# Patient Record
Sex: Female | Born: 1955 | Race: White | Hispanic: No | Marital: Married | Smoking: Former smoker
Health system: Southern US, Community
[De-identification: ages and names within clinical notes are randomized; demographics above are authoritative.]

## PROBLEM LIST (undated history)

## (undated) DIAGNOSIS — F329 Major depressive disorder, single episode, unspecified: Secondary | ICD-10-CM

## (undated) DIAGNOSIS — R112 Nausea with vomiting, unspecified: Secondary | ICD-10-CM

## (undated) DIAGNOSIS — H9190 Unspecified hearing loss, unspecified ear: Secondary | ICD-10-CM

## (undated) DIAGNOSIS — F32A Depression, unspecified: Secondary | ICD-10-CM

## (undated) DIAGNOSIS — E7800 Pure hypercholesterolemia, unspecified: Secondary | ICD-10-CM

## (undated) DIAGNOSIS — M1990 Unspecified osteoarthritis, unspecified site: Secondary | ICD-10-CM

## (undated) DIAGNOSIS — E782 Mixed hyperlipidemia: Secondary | ICD-10-CM

## (undated) DIAGNOSIS — R011 Cardiac murmur, unspecified: Secondary | ICD-10-CM

## (undated) DIAGNOSIS — Z9889 Other specified postprocedural states: Secondary | ICD-10-CM

## (undated) DIAGNOSIS — M79673 Pain in unspecified foot: Secondary | ICD-10-CM

## (undated) DIAGNOSIS — R062 Wheezing: Secondary | ICD-10-CM

## (undated) HISTORY — DX: Pure hypercholesterolemia, unspecified: E78.00

## (undated) HISTORY — PX: HALLUX VALGUS CORRECTION: SUR315

## (undated) HISTORY — PX: TYMPANOPLASTY: SHX33

## (undated) HISTORY — PX: OTHER SURGICAL HISTORY: SHX169

---

## 2007-07-18 ENCOUNTER — Ambulatory Visit: Payer: Self-pay | Admitting: Family Medicine

## 2008-09-03 ENCOUNTER — Ambulatory Visit: Payer: Self-pay | Admitting: Family Medicine

## 2010-01-08 ENCOUNTER — Ambulatory Visit: Payer: Self-pay | Admitting: Family Medicine

## 2012-06-23 ENCOUNTER — Ambulatory Visit: Payer: Self-pay | Admitting: Family Medicine

## 2013-03-16 ENCOUNTER — Encounter: Payer: Self-pay | Admitting: Podiatry

## 2013-03-16 ENCOUNTER — Ambulatory Visit (INDEPENDENT_AMBULATORY_CARE_PROVIDER_SITE_OTHER): Payer: BC Managed Care – PPO

## 2013-03-16 ENCOUNTER — Ambulatory Visit (INDEPENDENT_AMBULATORY_CARE_PROVIDER_SITE_OTHER): Payer: BC Managed Care – PPO | Admitting: Podiatry

## 2013-03-16 VITALS — BP 138/78 | HR 90 | Resp 16 | Ht 65.0 in | Wt 150.0 lb

## 2013-03-16 DIAGNOSIS — M779 Enthesopathy, unspecified: Secondary | ICD-10-CM

## 2013-03-16 DIAGNOSIS — M21619 Bunion of unspecified foot: Secondary | ICD-10-CM

## 2013-03-16 DIAGNOSIS — M21611 Bunion of right foot: Secondary | ICD-10-CM

## 2013-03-16 DIAGNOSIS — M21612 Bunion of left foot: Principal | ICD-10-CM

## 2013-03-16 DIAGNOSIS — M2020 Hallux rigidus, unspecified foot: Secondary | ICD-10-CM

## 2013-03-16 NOTE — Progress Notes (Signed)
   Subjective:    Patient ID: Jasmin AmmonsJane Mathews, female    DOB: Jun 08, 1955, 58 y.o.   MRN: [id number]  HPI Comments: Bunion right foot , sometimes painful, started many years ago,just decided it was time to do something about it .     Review of Systems  All other systems reviewed and are negative.       Objective:   Physical Exam        Assessment & Plan:

## 2013-03-16 NOTE — Progress Notes (Signed)
Subjective:     Patient ID: Jasmin AmmonsJane Mathews, female   DOB: [birth year], 58 y.o.   MRN: [id number]  HPI patient presents stating her big toe joint is getting increasingly painful over the last few years. States that she's tried shoe modifications and soaks without relief of symptoms and it hurts when walking or with certain types of shoes  Review of Systems  All other systems reviewed and are negative.       Objective:   Physical Exam  Nursing note and vitals reviewed. Constitutional: She is oriented to person, place, and time.  Cardiovascular: Intact distal pulses.   Musculoskeletal: Normal range of motion.  Neurological: She is oriented to person, place, and time.  Skin: Skin is warm.   neurovascular status intact with normal muscle strength and normal range of motion the subtalar and midtarsal joint. First MPJ joint range of motion right is compromised with approximate 10-15 of dorsiflexion and 10 of plantarflexion with no crepitus with movement but discomfort upon and movement. Discomfort within the first MPJ that is present right foot upon palpation. Fill time is noted to be normal and no equinus condition was noted. I also noted moderate prominence around the first metatarsal head right that becomes tender with pressure     Assessment:     Hallux limitus rigidus condition first MPJ right with structural bunion also noted    Plan:     H&P and x-rays reviewed of feet. Discussed at great length his big toe joint and the treatment options available. Patient has opted for surgical intervention given the length of time she's had and should like to do consent form today I allowed her to read consent form limine line with ample opportunities for question her biplanar Austin bunionectomy or possible joint implantation if the cartilage is severely compromised. I explained that if we do rebuild the joint there is a chance long-term she will require fusion or implant depending on how it responds. She  wants surgery understanding all of this and understand recovery takes  6 months to one year for long-term procedure. Dispense air fracture walker with all instructions on usage and scheduled for outpatient surgery in the next 2-3 weeks encouraged to call with questions

## 2013-03-16 NOTE — Patient Instructions (Signed)

## 2013-03-21 ENCOUNTER — Encounter: Payer: Self-pay | Admitting: Podiatry

## 2013-03-21 NOTE — Progress Notes (Signed)
Pt called and spoke with shelly, cancelled surgery scheduled on 3.3.15 with dr Charlsie Merlesregal due to having arthritis issues. Did not want to r/s surgery at this time.

## 2013-04-10 ENCOUNTER — Encounter: Payer: BC Managed Care – PPO | Admitting: Podiatry

## 2014-04-09 ENCOUNTER — Ambulatory Visit: Payer: Self-pay | Admitting: Family Medicine

## 2014-11-14 ENCOUNTER — Encounter: Payer: Self-pay | Admitting: *Deleted

## 2014-11-15 ENCOUNTER — Ambulatory Visit
Admission: RE | Admit: 2014-11-15 | Discharge: 2014-11-15 | Disposition: A | Discharge status: Home / Self Care | Payer: Commercial Managed Care - HMO | Source: Ambulatory Visit | Attending: Gastroenterology | Admitting: Gastroenterology

## 2014-11-15 ENCOUNTER — Ambulatory Visit: Payer: Commercial Managed Care - HMO | Admitting: Certified Registered"

## 2014-11-15 ENCOUNTER — Encounter: Payer: Self-pay | Admitting: *Deleted

## 2014-11-15 ENCOUNTER — Encounter
Admission: RE | Disposition: A | Discharge status: Home / Self Care | Payer: Self-pay | Source: Ambulatory Visit | Attending: Gastroenterology

## 2014-11-15 DIAGNOSIS — Z1211 Encounter for screening for malignant neoplasm of colon: Secondary | ICD-10-CM | POA: Insufficient documentation

## 2014-11-15 DIAGNOSIS — Z79899 Other long term (current) drug therapy: Secondary | ICD-10-CM | POA: Insufficient documentation

## 2014-11-15 DIAGNOSIS — F329 Major depressive disorder, single episode, unspecified: Secondary | ICD-10-CM | POA: Diagnosis not present

## 2014-11-15 DIAGNOSIS — E7800 Pure hypercholesterolemia, unspecified: Secondary | ICD-10-CM | POA: Insufficient documentation

## 2014-11-15 DIAGNOSIS — K5730 Diverticulosis of large intestine without perforation or abscess without bleeding: Secondary | ICD-10-CM | POA: Insufficient documentation

## 2014-11-15 DIAGNOSIS — E781 Pure hyperglyceridemia: Secondary | ICD-10-CM | POA: Diagnosis not present

## 2014-11-15 HISTORY — DX: Mixed hyperlipidemia: E78.2

## 2014-11-15 HISTORY — DX: Depression, unspecified: F32.A

## 2014-11-15 HISTORY — DX: Major depressive disorder, single episode, unspecified: F32.9

## 2014-11-15 HISTORY — DX: Pain in unspecified foot: M79.673

## 2014-11-15 HISTORY — DX: Other specified postprocedural states: Z98.890

## 2014-11-15 HISTORY — DX: Nausea with vomiting, unspecified: R11.2

## 2014-11-15 HISTORY — PX: COLONOSCOPY WITH PROPOFOL: SHX5780

## 2014-11-15 SURGERY — COLONOSCOPY WITH PROPOFOL
Anesthesia: General

## 2014-11-15 MED ORDER — MIDAZOLAM HCL 5 MG/5ML IJ SOLN
INTRAMUSCULAR | Status: DC | PRN
Start: 2014-11-15 — End: 2014-11-15
  Administered 2014-11-15 (×2): 1 mg via INTRAVENOUS

## 2014-11-15 MED ORDER — SODIUM CHLORIDE 0.9 % IV SOLN
INTRAVENOUS | Status: DC
  Administered 2014-11-15: 1000 mL via INTRAVENOUS

## 2014-11-15 MED ORDER — SODIUM CHLORIDE 0.9 % IV SOLN: INTRAVENOUS | Status: DC

## 2014-11-15 MED ORDER — LIDOCAINE HCL (CARDIAC) 20 MG/ML IV SOLN
INTRAVENOUS | Status: DC | PRN
  Administered 2014-11-15: 50 mg via INTRAVENOUS

## 2014-11-15 MED ORDER — PROPOFOL 10 MG/ML IV BOLUS
INTRAVENOUS | Status: DC | PRN
  Administered 2014-11-15: 70 mg via INTRAVENOUS

## 2014-11-15 MED ORDER — PROPOFOL 500 MG/50ML IV EMUL
INTRAVENOUS | Status: DC | PRN
  Administered 2014-11-15: 120 ug/kg/min via INTRAVENOUS

## 2014-11-15 NOTE — Op Note (Signed)
[facility] Gastroenterology Patient Name: Jasmin AmmonsJane Hollon Procedure Date: 11/15/2014 2:20 PM MRN: [id number] Account #: [id number] Date of Birth: [birth year] Admit Type: Outpatient Age: [id number] [facility] END[facility] ROOM 3 Gender: Female Note Status: Finalized Procedure:         Colonoscopy Indications:       Screening for colorectal malignant neoplasm, This is the                     patient's first colonoscopy Providers:         Christena DeemMartin U. Roseland Braun, MD Referring MD:      Leim FabryBarbara Aldridge, MD (Referring MD) Medicines:         Monitored Anesthesia Care Complications:     No immediate complications. Procedure:         Pre-Anesthesia Assessment:                    - ASA Grade Assessment: II - A patient with mild systemic                     disease.                    After obtaining informed consent, the colonoscope was                     passed under direct vision. Throughout the procedure, the                     patient's blood pressure, pulse, and oxygen saturations                     were monitored continuously. The Colonoscope was                     introduced through the anus and advanced to the the cecum,                     identified by appendiceal orifice and ileocecal valve. The                     colonoscopy was performed without difficulty. The patient                     tolerated the procedure well. The quality of the bowel                     preparation was good. Findings:      Multiple small to medium diverticula were found in the sigmoid colon and       in the descending colon.      The exam was otherwise without abnormality.      The digital rectal exam was normal.      Non-bleeding internal hemorrhoids were found during anoscopy. The       hemorrhoids were small. Impression:        - Diverticulosis in the sigmoid colon and in the                     descending colon.                    - The examination was otherwise normal.                    - No  specimens collected. Recommendation:    -  Repeat colonoscopy in 10 years for screening purposes. Procedure Code(s): --- Professional ---                    [phone], Colonoscopy, flexible; diagnostic, including                     collection of specimen(s) by brushing or washing, when                     performed (separate procedure) Diagnosis Code(s): --- Professional ---                    V76.51, Special screening for malignant neoplasms of colon                    562.10, Diverticulosis of colon (without mention of                     hemorrhage) CPT copyright 2014 American Medical Association. All rights reserved. The codes documented in this report are preliminary and upon coder review may  be revised to meet current compliance requirements. Christena Deem, MD 11/15/2014 2:47:03 PM This report has been signed electronically. Number of Addenda: 0 Note Initiated On: 11/15/2014 2:20 PM Scope Withdrawal Time: 0 hours 9 minutes 38 seconds  Total Procedure Duration: 0 hours 18 minutes 17 seconds       [facility]

## 2014-11-15 NOTE — Anesthesia Postprocedure Evaluation (Signed)
  Anesthesia Post-op Note  Patient: Jasmin Mathews  Procedure(s) Performed: Procedure(s): COLONOSCOPY WITH PROPOFOL (N/A)  Anesthesia type:General  Patient location: PACU  Post pain: Pain level controlled  Post assessment: Post-op Vital signs reviewed, Patient's Cardiovascular Status Stable, Respiratory Function Stable, Patent Airway and No signs of Nausea or vomiting  Post vital signs: Reviewed and stable  Last Vitals:  Filed Vitals:   11/15/14 1446  BP: 112/63  Pulse: 82  Temp: 36.2 C  Resp: 18    Level of consciousness: awake, alert  and patient cooperative  Complications: No apparent anesthesia complications

## 2014-11-15 NOTE — Transfer of Care (Signed)
Immediate Anesthesia Transfer of Care Note  Patient: Ardelle BallsJane Wrona Fuerstenberg  Procedure(s) Performed: Procedure(s): COLONOSCOPY WITH PROPOFOL (N/A)  Patient Location: Endoscopy Unit  Anesthesia Type:General  Level of Consciousness: awake  Airway & Oxygen Therapy: Patient Spontanous Breathing and Patient connected to nasal cannula oxygen  Post-op Assessment: Report given to RN  Post vital signs: Reviewed  Last Vitals:  Filed Vitals:   11/15/14 1446  BP: 112/63  Pulse: 82  Temp: 36.2 C  Resp: 18    Complications: No apparent anesthesia complications

## 2014-11-15 NOTE — Anesthesia Preprocedure Evaluation (Signed)
Anesthesia Evaluation  Patient identified by MRN, date of birth, ID band Patient awake    Reviewed: Allergy & Precautions, NPO status , Patient's Chart, lab work & pertinent test results  History of Anesthesia Complications (+) PONV  Airway Mallampati: III  TM Distance: >3 FB Neck ROM: Limited    Dental  (+) Teeth Intact   Pulmonary former smoker,    Pulmonary exam normal        Cardiovascular negative cardio ROS Normal cardiovascular exam     Neuro/Psych Depression    GI/Hepatic   Endo/Other    Renal/GU      Musculoskeletal   Abdominal (+)  Abdomen: soft.    Peds  Hematology   Anesthesia Other Findings   Reproductive/Obstetrics                             Anesthesia Physical Anesthesia Plan  ASA: II  Anesthesia Plan: General   Post-op Pain Management:    Induction: Intravenous  Airway Management Planned: Nasal Cannula  Additional Equipment:   Intra-op Plan:   Post-operative Plan:   Informed Consent: I have reviewed the patients History and Physical, chart, labs and discussed the procedure including the risks, benefits and alternatives for the proposed anesthesia with the patient or authorized representative who has indicated his/her understanding and acceptance.     Plan Discussed with: CRNA  Anesthesia Plan Comments: (PONV is from the chart--she herself denies problems with anesthesia.)        Anesthesia Quick Evaluation

## 2014-11-15 NOTE — H&P (Signed)
Outpatient short stay form Pre-procedure 11/15/2014 2:07 PM Jasmin Mathews Jasmin Minassian MD  Primary Physician: Dr. Leim FabryBarbara Mathews  Reason for visit:  Screening colonoscopy  History of present illness:  Patient is a 59 year old female presenting today for colonoscopy. She has not had a colonoscopy in the past. Tolerated her prep well. He takes no aspirin products or blood thinners.    Current facility-administered medications:  .  0.9 %  sodium chloride infusion, , Intravenous, Continuous, Jasmin Mathews Jasmin Upshur, MD, Last Rate: 20 mL/hr at 11/15/14 1353, 1,000 mL at 11/15/14 1353 .  0.9 %  sodium chloride infusion, , Intravenous, Continuous, Jasmin Mathews Jasmin Klang, MD  Prescriptions prior to admission  Medication Sig Dispense Refill Last Dose  . PARoxetine (PAXIL) 30 MG tablet      . simvastatin (ZOCOR) 20 MG tablet      . venlafaxine (EFFEXOR) 75 MG tablet         No Known Allergies   Past Medical History  Diagnosis Date  . High cholesterol   . Depression   . Foot arch pain   . PONV (postoperative nausea and vomiting)   . Elevated cholesterol with high triglycerides     Review of systems:      Physical Exam    Heart and lungs: Regular rate and rhythm without rub or gallop, lungs are bilaterally clear.    HEENT: Normocephalic atraumatic eyes are anicteric    Other:     Pertinant exam for procedure: Soft nontender nondistended bowel sounds are positive normoactive    Planned proceedures: Colonoscopy and indicated procedures I have discussed the risks benefits and complications of procedures to include not limited to bleeding, infection, perforation and the risk of sedation and the patient wishes to proceed.    Jasmin Mathews Jasmin Moorehouse, MD Gastroenterology 11/15/2014  2:07 PM

## 2014-11-20 ENCOUNTER — Encounter: Payer: Self-pay | Admitting: Gastroenterology

## 2016-06-07 ENCOUNTER — Other Ambulatory Visit: Payer: Self-pay | Admitting: Family Medicine

## 2016-06-07 DIAGNOSIS — Z1231 Encounter for screening mammogram for malignant neoplasm of breast: Secondary | ICD-10-CM

## 2016-06-24 ENCOUNTER — Ambulatory Visit
Admission: RE | Admit: 2016-06-24 | Discharge: 2016-06-24 | Disposition: A | Discharge status: Home / Self Care | Payer: BLUE CROSS/BLUE SHIELD | Source: Ambulatory Visit | Attending: Family Medicine | Admitting: Family Medicine

## 2016-06-24 DIAGNOSIS — Z1231 Encounter for screening mammogram for malignant neoplasm of breast: Secondary | ICD-10-CM | POA: Insufficient documentation

## 2016-12-05 ENCOUNTER — Other Ambulatory Visit: Payer: Self-pay

## 2016-12-05 ENCOUNTER — Emergency Department
Admission: EM | Admit: 2016-12-05 | Discharge: 2016-12-05 | Disposition: A | Discharge status: Home / Self Care | Payer: Managed Care, Other (non HMO) | Attending: Emergency Medicine | Admitting: Emergency Medicine

## 2016-12-05 ENCOUNTER — Encounter: Payer: Self-pay | Admitting: Emergency Medicine

## 2016-12-05 ENCOUNTER — Emergency Department: Payer: Managed Care, Other (non HMO)

## 2016-12-05 DIAGNOSIS — Y9389 Activity, other specified: Secondary | ICD-10-CM | POA: Diagnosis not present

## 2016-12-05 DIAGNOSIS — Y9289 Other specified places as the place of occurrence of the external cause: Secondary | ICD-10-CM | POA: Diagnosis not present

## 2016-12-05 DIAGNOSIS — W19XXXA Unspecified fall, initial encounter: Secondary | ICD-10-CM

## 2016-12-05 DIAGNOSIS — W010XXA Fall on same level from slipping, tripping and stumbling without subsequent striking against object, initial encounter: Secondary | ICD-10-CM | POA: Insufficient documentation

## 2016-12-05 DIAGNOSIS — S299XXA Unspecified injury of thorax, initial encounter: Secondary | ICD-10-CM | POA: Diagnosis present

## 2016-12-05 DIAGNOSIS — S20212A Contusion of left front wall of thorax, initial encounter: Secondary | ICD-10-CM | POA: Insufficient documentation

## 2016-12-05 DIAGNOSIS — Z87891 Personal history of nicotine dependence: Secondary | ICD-10-CM | POA: Diagnosis not present

## 2016-12-05 DIAGNOSIS — Y92009 Unspecified place in unspecified non-institutional (private) residence as the place of occurrence of the external cause: Secondary | ICD-10-CM

## 2016-12-05 DIAGNOSIS — Y999 Unspecified external cause status: Secondary | ICD-10-CM | POA: Insufficient documentation

## 2016-12-05 MED ORDER — HYDROCODONE-ACETAMINOPHEN 5-325 MG PO TABS
1.0000 | ORAL_TABLET | Freq: Once | ORAL | Status: AC
  Administered 2016-12-05: 1 via ORAL
  Filled 2016-12-05: qty 1

## 2016-12-05 MED ORDER — HYDROCODONE-ACETAMINOPHEN 5-325 MG PO TABS: 2.0000 | ORAL_TABLET | Freq: Four times a day (QID) | ORAL | 0 refills | Status: DC | PRN

## 2016-12-05 NOTE — ED Provider Notes (Signed)
[facility] Emergency Department Provider Note  ____________________________________________   First MD Initiated Contact with Patient 12/05/16 1216     (approximate)  I have reviewed the triage vital signs and the nursing notes.   HISTORY  Chief Complaint Fall   HPI Jasmin Mathews is a 61 y.o. female she is complaining of left rib pain.patient states that she was outside last evening with her dog when she slipped in some mud causing her to fall on her left side. Since that time it has been very painful for her to take deep inspirations. Today it is been worse. She has takenTylenol approximately 4 hours ago with minimal relief of her pain. She denies any head injury or loss of consciousness.she rates her pain as 4 out of 10.     Past Medical History:  Diagnosis Date  . Depression   . Elevated cholesterol with high triglycerides   . Foot arch pain   . High cholesterol   . PONV (postoperative nausea and vomiting)     There are no active problems to display for this patient.   Past Surgical History:  Procedure Laterality Date  . Fusion rt great toe Right   . HALLUX VALGUS CORRECTION Right     Prior to Admission medications   Medication Sig Start Date End Date Taking? Authorizing Provider  HYDROcodone-acetaminophen (NORCO/VICODIN) 5-325 MG tablet Take 2 tablets every 6 (six) hours as needed by mouth for moderate pain. 12/05/16 12/05/17  Tommi Rumps, PA-C  PARoxetine (PAXIL) 30 MG tablet  03/06/13   [provider]  simvastatin (ZOCOR) 20 MG tablet  01/24/13   [provider]  venlafaxine [facility]) 75 MG tablet  03/12/13   [provider]    Allergies Patient has no known allergies.  Family History  Problem Relation Age of Onset  . Breast cancer Neg Hx     Social History Social History   Tobacco Use  . Smoking status: Former Games developer  . Smokeless tobacco: Never Used  Substance Use Topics  . Alcohol use: No    . Drug use: No    Review of Systems Constitutional: No fever/chills Eyes: No visual changes. ENT: no trauma Cardiovascular: Denies chest pain. Respiratory: Denies shortness of breath. Gastrointestinal: No abdominal pain.  No nausea, no vomiting.  Musculoskeletal: positive for left rib pain. Skin: No injury. Neurological: Negative for headaches, focal weakness or numbness. ____________________________________________   PHYSICAL EXAM:  VITAL SIGNS: ED Triage Vitals [12/05/16 1158]  Enc Vitals Group     BP 139/73     Pulse Rate 80     Resp 16     Temp 97.6 F (36.4 C)     Temp Source Oral     SpO2 98 %     Weight      Height      Head Circumference      Peak Flow      Pain Score 4     Pain Loc      Pain Edu?      Excl. in GC?    Constitutional: Alert and oriented. Well appearing and in no acute distress. Eyes: Conjunctivae are normal.  Head: Atraumatic. Nose: no trauma Neck: No stridor.  No cervical tenderness on palpation posteriorly. Range of motion was without restriction. Cardiovascular: Normal rate, regular rhythm. Grossly normal heart sounds.  Good peripheral circulation. Respiratory: Normal respiratory effort.  No retractions. Lungs CTAB. There is moderate tenderness on palpation of the left lower ribs  however there is no soft tissue swelling or ecchymosis noted. Deep inspirations or guarded secondary to patient's discomfort. Gastrointestinal: Soft and nontender. No distention.  Musculoskeletal: to his upper and lower extremities without difficulty. Normal gait was noted. Neurologic:  Normal speech and language. No gross focal neurologic deficits are appreciated.  Skin:  Skin is warm, dry and intact. Psychiatric: Mood and affect are normal. Speech and behavior are normal.  ____________________________________________   LABS (all labs ordered are listed, but only abnormal results are displayed)  Labs Reviewed - No data to display   RADIOLOGY  Dg Ribs  Unilateral W/chest Left  Result Date: 12/05/2016 CLINICAL DATA:  61 year old female status post fall last night outside on to brick patio. Pain under the left breast. EXAM: LEFT RIBS AND CHEST - 3+ VIEW COMPARISON:  None. FINDINGS: Normal cardiac size and mediastinal contours. Visualized tracheal air column is within normal limits. Lung volumes are within normal limits. No pneumothorax, pulmonary edema, pleural effusion or confluent opacity. Negative visible bowel gas pattern. Bone mineralization is within normal limits for age. Rib marker placed at the left anterior seventh/eighth rib level. No displaced rib fracture identified. Normal thoracic segmentation. Other visible osseous structures appear intact. IMPRESSION: 1.  No displaced left rib fracture identified. 2.  No acute cardiopulmonary abnormality. Electronically Signed   By: Odessa FlemingH  Hall M.D.   On: 12/05/2016 14:01    ____________________________________________   PROCEDURES  Procedure(s) performed: None  Procedures  Critical Care performed: No  ____________________________________________   INITIAL IMPRESSION / ASSESSMENT AND PLAN / ED COURSE  Patient was given Norco while in the emergency department. X-rays were discussed with both she and her husband. Patient was discharged with a prescription for Norco one every 6 hours as needed for pain.  Patient is follow-up with her PCP if any continued problems. ____________________________________________   FINAL CLINICAL IMPRESSION(S) / ED DIAGNOSES  Final diagnoses:  Rib contusion, left, initial encounter  Fall in home, initial encounter      NEW MEDICATIONS STARTED DURING THIS VISIT:  This SmartLink is deprecated. Use AVSMEDLIST instead to display the medication list for a patient.   Note:  This document was prepared using Dragon voice recognition software and may include unintentional dictation errors.    Tommi RumpsSummers, Tykel Badie L, PA-C 12/05/16 1526    Governor RooksLord, Rebecca,  MD 12/08/16 1120

## 2016-12-05 NOTE — ED Triage Notes (Signed)
Pt states that she was outside last night walking her dogs and slipped in the mud landing on her left side. Pt is c/o pain in left ribs that is worse with movement. Pt in NAD at this time.

## 2016-12-05 NOTE — Discharge Instructions (Signed)
Follow up with primary care doctor if any continued problems. Begin taking ibuprofen 2 tablets 3 times a day with food. Take Norco as needed for moderate to severe pain. This medication could cause drowsiness increase her risk for falling. Do not drive or operate machinery while taking this medication. You may use ice to decrease swelling or pain.

## 2017-05-12 ENCOUNTER — Encounter: Payer: Self-pay | Admitting: *Deleted

## 2017-05-24 ENCOUNTER — Ambulatory Visit: Payer: Managed Care, Other (non HMO) | Admitting: Anesthesiology

## 2017-05-24 ENCOUNTER — Ambulatory Visit
Admission: RE | Admit: 2017-05-24 | Discharge: 2017-05-24 | Disposition: A | Discharge status: Home / Self Care | Payer: Managed Care, Other (non HMO) | Source: Ambulatory Visit | Attending: Ophthalmology | Admitting: Ophthalmology

## 2017-05-24 ENCOUNTER — Encounter
Admission: RE | Disposition: A | Discharge status: Home / Self Care | Payer: Self-pay | Source: Ambulatory Visit | Attending: Ophthalmology

## 2017-05-24 DIAGNOSIS — H2512 Age-related nuclear cataract, left eye: Secondary | ICD-10-CM | POA: Insufficient documentation

## 2017-05-24 DIAGNOSIS — Z87891 Personal history of nicotine dependence: Secondary | ICD-10-CM | POA: Diagnosis not present

## 2017-05-24 HISTORY — DX: Unspecified osteoarthritis, unspecified site: M19.90

## 2017-05-24 HISTORY — PX: CATARACT EXTRACTION W/PHACO: SHX586

## 2017-05-24 HISTORY — DX: Unspecified hearing loss, unspecified ear: H91.90

## 2017-05-24 HISTORY — DX: Cardiac murmur, unspecified: R01.1

## 2017-05-24 HISTORY — DX: Wheezing: R06.2

## 2017-05-24 SURGERY — PHACOEMULSIFICATION, CATARACT, WITH IOL INSERTION
Anesthesia: Monitor Anesthesia Care | Site: Eye | Laterality: Left | Wound class: "Clean "

## 2017-05-24 MED ORDER — EPINEPHRINE PF 1 MG/ML IJ SOLN
INTRAOCULAR | Status: DC | PRN
  Administered 2017-05-24: 1 mL via OPHTHALMIC

## 2017-05-24 MED ORDER — FENTANYL CITRATE (PF) 100 MCG/2ML IJ SOLN
INTRAMUSCULAR | Status: AC
  Filled 2017-05-24: qty 2

## 2017-05-24 MED ORDER — CARBACHOL 0.01 % IO SOLN
INTRAOCULAR | Status: DC | PRN
  Administered 2017-05-24: .5 mL via INTRAOCULAR

## 2017-05-24 MED ORDER — MOXIFLOXACIN HCL 0.5 % OP SOLN
OPHTHALMIC | Status: AC
  Filled 2017-05-24: qty 3

## 2017-05-24 MED ORDER — MIDAZOLAM HCL 2 MG/2ML IJ SOLN
INTRAMUSCULAR | Status: DC | PRN
  Administered 2017-05-24: 2 mg via INTRAVENOUS

## 2017-05-24 MED ORDER — ARMC OPHTHALMIC DILATING DROPS
OPHTHALMIC | Status: AC
  Administered 2017-05-24: 1 via OPHTHALMIC
  Filled 2017-05-24: qty 0.4

## 2017-05-24 MED ORDER — FENTANYL CITRATE (PF) 100 MCG/2ML IJ SOLN
INTRAMUSCULAR | Status: DC | PRN
  Administered 2017-05-24: 50 ug via INTRAVENOUS
  Administered 2017-05-24 (×2): 25 ug via INTRAVENOUS

## 2017-05-24 MED ORDER — LIDOCAINE HCL (PF) 4 % IJ SOLN
INTRAOCULAR | Status: DC | PRN
  Administered 2017-05-24: 2 mL via OPHTHALMIC

## 2017-05-24 MED ORDER — POVIDONE-IODINE 5 % OP SOLN
OPHTHALMIC | Status: DC | PRN
  Administered 2017-05-24: 1 via OPHTHALMIC

## 2017-05-24 MED ORDER — MOXIFLOXACIN HCL 0.5 % OP SOLN: 1.0000 [drp] | OPHTHALMIC | Status: DC | PRN

## 2017-05-24 MED ORDER — ARMC OPHTHALMIC DILATING DROPS
1.0000 "application " | OPHTHALMIC | Status: AC
  Administered 2017-05-24 (×3): 1 via OPHTHALMIC

## 2017-05-24 MED ORDER — MOXIFLOXACIN HCL 0.5 % OP SOLN
OPHTHALMIC | Status: DC | PRN
  Administered 2017-05-24: .2 mL via OPHTHALMIC

## 2017-05-24 MED ORDER — MIDAZOLAM HCL 2 MG/2ML IJ SOLN
INTRAMUSCULAR | Status: AC
  Filled 2017-05-24: qty 2

## 2017-05-24 MED ORDER — SODIUM CHLORIDE 0.9 % IV SOLN
INTRAVENOUS | Status: DC
  Administered 2017-05-24: 10:00:00 via INTRAVENOUS

## 2017-05-24 MED ORDER — NA CHONDROIT SULF-NA HYALURON 40-17 MG/ML IO SOLN
INTRAOCULAR | Status: DC | PRN
  Administered 2017-05-24: 1 mL via INTRAOCULAR

## 2017-05-24 SURGICAL SUPPLY — 16 items
GLOVE BIO SURGEON STRL SZ8 (GLOVE) ×3 IMPLANT
GLOVE BIOGEL M 6.5 STRL (GLOVE) ×3 IMPLANT
GLOVE SURG LX 8.0 MICRO (GLOVE) ×2
GLOVE SURG LX STRL 8.0 MICRO (GLOVE) ×1 IMPLANT
GOWN STRL REUS W/ TWL LRG LVL3 (GOWN DISPOSABLE) ×2 IMPLANT
GOWN STRL REUS W/TWL LRG LVL3 (GOWN DISPOSABLE) ×4
LABEL CATARACT MEDS ST (LABEL) ×3 IMPLANT
LENS IOL TECNIS ITEC 19.0 (Intraocular Lens) ×2 IMPLANT
PACK CATARACT (MISCELLANEOUS) ×3 IMPLANT
PACK CATARACT BRASINGTON LX (MISCELLANEOUS) ×3 IMPLANT
PACK EYE AFTER SURG (MISCELLANEOUS) ×3 IMPLANT
SOL BSS BAG (MISCELLANEOUS) ×3
SOLUTION BSS BAG (MISCELLANEOUS) ×1 IMPLANT
SYR 5ML LL (SYRINGE) ×3 IMPLANT
WATER STERILE IRR 250ML POUR (IV SOLUTION) ×3 IMPLANT
WIPE NON LINTING 3.25X3.25 (MISCELLANEOUS) ×3 IMPLANT

## 2017-05-24 NOTE — Discharge Instructions (Signed)
FOLLOW DR. PORFILIO'S POSTOP DISCHARGE INSTRUCTION SHEET AS REVIEWED.  Eye Surgery Discharge Instructions  Expect mild scratchy sensation or mild soreness. DO NOT RUB YOUR EYE!  The day of surgery:  Minimal physical activity, but bed rest is not required  No reading, computer work, or close hand work  No bending, lifting, or straining.  May watch TV  For 24 hours:  No driving, legal decisions, or alcoholic beverages  Safety precautions  Eat anything you prefer: It is better to start with liquids, then soup then solid foods.  _____ Eye patch should be worn until postoperative exam tomorrow.  ____ Solar shield eyeglasses should be worn for comfort in the sunlight/patch while sleeping  Resume all regular medications including aspirin or Coumadin if these were discontinued prior to surgery. You may shower, bathe, shave, or wash your hair. Tylenol may be taken for mild discomfort.  Call your doctor if you experience significant pain, nausea, or vomiting, fever > 101 or other signs of infection. [phone] or [phone] Specific instructions:  Follow-up Information    Galen ManilaPorfilio, William, MD Follow up.   Specialty:  Ophthalmology Why:  Wednesday 05/25/17 @ 9:00 am Contact information: [address] [place] [id number] [phone]

## 2017-05-24 NOTE — Op Note (Signed)
PREOPERATIVE DIAGNOSIS:  Nuclear sclerotic cataract of the left eye.   POSTOPERATIVE DIAGNOSIS:  Nuclear sclerotic cataract of the left eye.   OPERATIVE PROCEDURE: Procedure(s): CATARACT EXTRACTION PHACO AND INTRAOCULAR LENS PLACEMENT (IOC)   SURGEON:  Galen ManilaWilliam Teoman Giraud, MD.   ANESTHESIA:  Anesthesiologist: Alver FisherPenwarden, Amy, MD CRNA: Karoline CaldwellStarr, Deana, CRNA Student Nurse Anesthetist: Marylen PontoVanderford, Kate W, RN  1.      Managed anesthesia care. 2.     0.751ml of Shugarcaine was instilled following the paracentesis   COMPLICATIONS:  None.   TECHNIQUE:   Stop and chop   DESCRIPTION OF PROCEDURE:  The patient was examined and consented in the preoperative holding area where the aforementioned topical anesthesia was applied to the left eye and then brought back to the Operating Room where the left eye was prepped and draped in the usual sterile ophthalmic fashion and a lid speculum was placed. A paracentesis was created with the side port blade and the anterior chamber was filled with viscoelastic. A near clear corneal incision was performed with the steel keratome. A continuous curvilinear capsulorrhexis was performed with a cystotome followed by the capsulorrhexis forceps. Hydrodissection and hydrodelineation were carried out with BSS on a blunt cannula. The lens was removed in a stop and chop  technique and the remaining cortical material was removed with the irrigation-aspiration handpiece. The capsular bag was inflated with viscoelastic and the Technis [id number] lens was placed in the capsular bag without complication. The remaining viscoelastic was removed from the eye with the irrigation-aspiration handpiece. The wounds were hydrated. The anterior chamber was flushed with Miostat and the eye was inflated to physiologic pressure. 0.571ml Vigamox was placed in the anterior chamber. The wounds were found to be water tight. The eye was dressed with Vigamox. The patient was given protective glasses to wear throughout  the day and a shield with which to sleep tonight. The patient was also given drops with which to begin a drop regimen today and will follow-up with me in one day. Implant Name Type Inv. Item Serial No. Manufacturer Lot No. LRB No. Used  LENS IOL DIOP 19.0 - [id number] Intraocular Lens LENS IOL DIOP 19.0 [id number] AMO  Left 1    Procedure(s) with comments: CATARACT EXTRACTION PHACO AND INTRAOCULAR LENS PLACEMENT (IOC) (Left) - US 00:21 AP% 11.0 CDE 2.32 Fluid pack lot # [id number]  Electronically signed: Galen ManilaWilliam Shanikia [employer] 05/24/2017 11:25 AM

## 2017-05-24 NOTE — Anesthesia Preprocedure Evaluation (Signed)
Anesthesia Evaluation  Patient identified by MRN, date of birth, ID band Patient awake    Reviewed: Allergy & Precautions, NPO status , Patient's Chart, lab work & pertinent test results  History of Anesthesia Complications (+) PONV and history of anesthetic complications  Airway Mallampati: II       Dental   Pulmonary neg sleep apnea, neg COPD, former smoker,           Cardiovascular (-) hypertension(-) Past MI and (-) CHF (-) pacemaker+ Valvular Problems/Murmurs (murmur, as a child)      Neuro/Psych neg Seizures Depression    GI/Hepatic Neg liver ROS, neg GERD  ,  Endo/Other  neg diabetes  Renal/GU negative Renal ROS     Musculoskeletal   Abdominal   Peds  Hematology   Anesthesia Other Findings   Reproductive/Obstetrics                             Anesthesia Physical Anesthesia Plan  ASA: II  Anesthesia Plan: MAC   Post-op Pain Management:    Induction:   PONV Risk Score and Plan:   Airway Management Planned: Nasal Cannula  Additional Equipment:   Intra-op Plan:   Post-operative Plan:   Informed Consent: I have reviewed the patients History and Physical, chart, labs and discussed the procedure including the risks, benefits and alternatives for the proposed anesthesia with the patient or authorized representative who has indicated his/her understanding and acceptance.     Plan Discussed with:   Anesthesia Plan Comments:         Anesthesia Quick Evaluation

## 2017-05-24 NOTE — Transfer of Care (Signed)
Immediate Anesthesia Transfer of Care Note  Patient: Jasmin Mathews  Procedure(s) Performed: CATARACT EXTRACTION PHACO AND INTRAOCULAR LENS PLACEMENT (IOC) (Left Eye)  Patient Location: PACU  Anesthesia Type:MAC  Level of Consciousness: awake, alert  and oriented  Airway & Oxygen Therapy: Patient Spontanous Breathing  Post-op Assessment: Report given to RN  Post vital signs: Reviewed and stable  Last Vitals:  Vitals Value Taken Time  BP    Temp    Pulse    Resp    SpO2      Last Pain:  Vitals:   05/24/17 1009  TempSrc: Oral  PainSc: 0-No pain         Complications: No apparent anesthesia complications

## 2017-05-24 NOTE — H&P (Signed)
All labs reviewed. Abnormal studies sent to patients PCP when indicated.  Previous H&P reviewed, patient examined, there are NO CHANGES.  Jasmin Tortorelli Porfilio4/23/201911:02 AM

## 2017-05-24 NOTE — Anesthesia Postprocedure Evaluation (Signed)
Anesthesia Post Note  Patient: Jasmin Mathews  Procedure(s) Performed: CATARACT EXTRACTION PHACO AND INTRAOCULAR LENS PLACEMENT (IOC) (Left Eye)  Patient location during evaluation: PACU Anesthesia Type: MAC Level of consciousness: awake and awake and alert Pain management: pain level controlled Vital Signs Assessment: post-procedure vital signs reviewed and stable Respiratory status: spontaneous breathing Cardiovascular status: blood pressure returned to baseline Postop Assessment: no apparent nausea or vomiting Anesthetic complications: no     Last Vitals:  Vitals:   05/24/17 1009  BP: (!) 124/91  Pulse: 79  Resp: 15  Temp: 36.4 C  SpO2: 98%    Last Pain:  Vitals:   05/24/17 1009  TempSrc: Oral  PainSc: 0-No pain                 Eloise Harman Jakyla Reza

## 2017-05-24 NOTE — Anesthesia Post-op Follow-up Note (Signed)
Anesthesia QCDR form completed.        

## 2017-05-25 ENCOUNTER — Encounter: Payer: Self-pay | Admitting: Ophthalmology

## 2017-06-09 ENCOUNTER — Encounter: Payer: Self-pay | Admitting: *Deleted

## 2017-06-14 ENCOUNTER — Ambulatory Visit: Payer: Managed Care, Other (non HMO) | Admitting: Anesthesiology

## 2017-06-14 ENCOUNTER — Encounter
Admission: RE | Disposition: A | Discharge status: Home / Self Care | Payer: Self-pay | Source: Ambulatory Visit | Attending: Ophthalmology

## 2017-06-14 ENCOUNTER — Ambulatory Visit
Admission: RE | Admit: 2017-06-14 | Discharge: 2017-06-14 | Disposition: A | Discharge status: Home / Self Care | Payer: Managed Care, Other (non HMO) | Source: Ambulatory Visit | Attending: Ophthalmology | Admitting: Ophthalmology

## 2017-06-14 DIAGNOSIS — Z87891 Personal history of nicotine dependence: Secondary | ICD-10-CM | POA: Insufficient documentation

## 2017-06-14 DIAGNOSIS — F329 Major depressive disorder, single episode, unspecified: Secondary | ICD-10-CM | POA: Diagnosis not present

## 2017-06-14 DIAGNOSIS — E7800 Pure hypercholesterolemia, unspecified: Secondary | ICD-10-CM | POA: Diagnosis not present

## 2017-06-14 DIAGNOSIS — H2511 Age-related nuclear cataract, right eye: Secondary | ICD-10-CM | POA: Insufficient documentation

## 2017-06-14 DIAGNOSIS — Z79899 Other long term (current) drug therapy: Secondary | ICD-10-CM | POA: Insufficient documentation

## 2017-06-14 HISTORY — PX: CATARACT EXTRACTION W/PHACO: SHX586

## 2017-06-14 SURGERY — PHACOEMULSIFICATION, CATARACT, WITH IOL INSERTION
Anesthesia: Monitor Anesthesia Care | Site: Eye | Laterality: Right | Wound class: "Clean "

## 2017-06-14 MED ORDER — MOXIFLOXACIN HCL 0.5 % OP SOLN: 1.0000 [drp] | OPHTHALMIC | Status: DC | PRN

## 2017-06-14 MED ORDER — MIDAZOLAM HCL 2 MG/2ML IJ SOLN
INTRAMUSCULAR | Status: AC
  Filled 2017-06-14: qty 2

## 2017-06-14 MED ORDER — POVIDONE-IODINE 5 % OP SOLN
OPHTHALMIC | Status: DC | PRN
  Administered 2017-06-14: 1 via OPHTHALMIC

## 2017-06-14 MED ORDER — LIDOCAINE HCL (PF) 4 % IJ SOLN
INTRAOCULAR | Status: DC | PRN
  Administered 2017-06-14: 4 mL via OPHTHALMIC

## 2017-06-14 MED ORDER — EPINEPHRINE PF 1 MG/ML IJ SOLN
INTRAOCULAR | Status: DC | PRN
  Administered 2017-06-14: 10:00:00 via OPHTHALMIC

## 2017-06-14 MED ORDER — FENTANYL CITRATE (PF) 100 MCG/2ML IJ SOLN
INTRAMUSCULAR | Status: AC
  Filled 2017-06-14: qty 2

## 2017-06-14 MED ORDER — MOXIFLOXACIN HCL 0.5 % OP SOLN
OPHTHALMIC | Status: DC | PRN
  Administered 2017-06-14: 0.2 mL via OPHTHALMIC

## 2017-06-14 MED ORDER — SODIUM CHLORIDE 0.9 % IV SOLN
INTRAVENOUS | Status: DC
  Administered 2017-06-14: 09:00:00 via INTRAVENOUS

## 2017-06-14 MED ORDER — ARMC OPHTHALMIC DILATING DROPS
1.0000 "application " | OPHTHALMIC | Status: AC
  Administered 2017-06-14 (×3): 1 via OPHTHALMIC

## 2017-06-14 MED ORDER — FENTANYL CITRATE (PF) 100 MCG/2ML IJ SOLN
INTRAMUSCULAR | Status: DC | PRN
  Administered 2017-06-14: 25 ug via INTRAVENOUS
  Administered 2017-06-14: 50 ug via INTRAVENOUS
  Administered 2017-06-14: 25 ug via INTRAVENOUS

## 2017-06-14 MED ORDER — MIDAZOLAM HCL 2 MG/2ML IJ SOLN
INTRAMUSCULAR | Status: DC | PRN
  Administered 2017-06-14 (×2): 1 mg via INTRAVENOUS

## 2017-06-14 MED ORDER — NA CHONDROIT SULF-NA HYALURON 40-17 MG/ML IO SOLN
INTRAOCULAR | Status: DC | PRN
  Administered 2017-06-14: 1 mL via INTRAOCULAR

## 2017-06-14 MED ORDER — CARBACHOL 0.01 % IO SOLN
INTRAOCULAR | Status: DC | PRN
  Administered 2017-06-14: 0.5 mL via INTRAOCULAR

## 2017-06-14 SURGICAL SUPPLY — 16 items
GLOVE BIO SURGEON STRL SZ8 (GLOVE) ×2 IMPLANT
GLOVE BIOGEL M 6.5 STRL (GLOVE) ×2 IMPLANT
GLOVE SURG LX 8.0 MICRO (GLOVE) ×1
GLOVE SURG LX STRL 8.0 MICRO (GLOVE) ×1 IMPLANT
GOWN STRL REUS W/ TWL LRG LVL3 (GOWN DISPOSABLE) ×2 IMPLANT
GOWN STRL REUS W/TWL LRG LVL3 (GOWN DISPOSABLE) ×2
LABEL CATARACT MEDS ST (LABEL) ×2 IMPLANT
LENS IOL TECNIS ITEC 19.5 (Intraocular Lens) ×1 IMPLANT
PACK CATARACT (MISCELLANEOUS) ×2 IMPLANT
PACK CATARACT BRASINGTON LX (MISCELLANEOUS) ×2 IMPLANT
PACK EYE AFTER SURG (MISCELLANEOUS) ×2 IMPLANT
SOL BSS BAG (MISCELLANEOUS) ×2
SOLUTION BSS BAG (MISCELLANEOUS) ×1 IMPLANT
SYR 5ML LL (SYRINGE) ×2 IMPLANT
WATER STERILE IRR 250ML POUR (IV SOLUTION) ×2 IMPLANT
WIPE NON LINTING 3.25X3.25 (MISCELLANEOUS) ×2 IMPLANT

## 2017-06-14 NOTE — Discharge Instructions (Signed)
Eye Surgery Discharge Instructions  Expect mild scratchy sensation or mild soreness. DO NOT RUB YOUR EYE!  The day of surgery:  Minimal physical activity, but bed rest is not required  No reading, computer work, or close hand work  No bending, lifting, or straining.  May watch TV  For 24 hours:  No driving, legal decisions, or alcoholic beverages  Safety precautions  Eat anything you prefer: It is better to start with liquids, then soup then solid foods.  _____ Eye patch should be worn until postoperative exam tomorrow.  ____ Solar shield eyeglasses should be worn for comfort in the sunlight/patch while sleeping  Resume all regular medications including aspirin or Coumadin if these were discontinued prior to surgery. You may shower, bathe, shave, or wash your hair. Tylenol may be taken for mild discomfort.  Call your doctor if you experience significant pain, nausea, or vomiting, fever > 101 or other signs of infection. [phone] or [phone] Specific instructions:  Follow-up Information    Galen Manila, MD Follow up.   Specialty:  Ophthalmology Why:  06/15/17 at 1:40 Contact information: [address] [place] [address] [phone]          Eye Surgery Discharge Instructions  Expect mild scratchy sensation or mild soreness. DO NOT RUB YOUR EYE!  The day of surgery:  Minimal physical activity, but bed rest is not required  No reading, computer work, or close hand work  No bending, lifting, or straining.  May watch TV  For 24 hours:  No driving, legal decisions, or alcoholic beverages  Safety precautions  Eat anything you prefer: It is better to start with liquids, then soup then solid foods.  _____ Eye patch should be worn until postoperative exam tomorrow.  ____ Solar shield eyeglasses should be worn for comfort in the sunlight/patch while sleeping  Resume all regular medications including aspirin or Coumadin if these were  discontinued prior to surgery. You may shower, bathe, shave, or wash your hair. Tylenol may be taken for mild discomfort.  Call your doctor if you experience significant pain, nausea, or vomiting, fever > 101 or other signs of infection. [phone] or [phone] Specific instructions:  Follow-up Information    Galen Manila, MD Follow up.   Specialty:  Ophthalmology Why:  06/15/17 at 1:40 Contact information: [address] [place] [address] [phone]

## 2017-06-14 NOTE — Anesthesia Post-op Follow-up Note (Signed)
Anesthesia QCDR form completed.        

## 2017-06-14 NOTE — Anesthesia Postprocedure Evaluation (Signed)
Anesthesia Post Note  Patient: Jasmin Mathews  Procedure(s) Performed: CATARACT EXTRACTION PHACO AND INTRAOCULAR LENS PLACEMENT (IOC) (Right Eye)  Patient location during evaluation: PACU Anesthesia Type: MAC Level of consciousness: awake Pain management: pain level controlled Vital Signs Assessment: post-procedure vital signs reviewed and stable Respiratory status: spontaneous breathing Cardiovascular status: blood pressure returned to baseline Postop Assessment: no apparent nausea or vomiting Anesthetic complications: no     Last Vitals:  Vitals:   06/14/17 0847  BP: (!) 149/93  Pulse: 87  Temp: 36.8 C    Last Pain:  Vitals:   06/14/17 0847  TempSrc: Oral  PainSc: 0-No pain                 Carron Curie

## 2017-06-14 NOTE — Transfer of Care (Signed)
Immediate Anesthesia Transfer of Care Note  Patient: Jasmin Mathews  Procedure(s) Performed: CATARACT EXTRACTION PHACO AND INTRAOCULAR LENS PLACEMENT (IOC) (Right Eye)  Patient Location: PACU  Anesthesia Type:MAC  Level of Consciousness: awake  Airway & Oxygen Therapy: Patient Spontanous Breathing  Post-op Assessment: Report given to RN  Post vital signs: stable  Last Vitals:  Vitals Value Taken Time  BP    Temp    Pulse    Resp    SpO2      Last Pain:  Vitals:   06/14/17 0847  TempSrc: Oral  PainSc: 0-No pain         Complications: No apparent anesthesia complications

## 2017-06-14 NOTE — H&P (Signed)
All labs reviewed. Abnormal studies sent to patients PCP when indicated.  Previous H&P reviewed, patient examined, there are NO CHANGES.  Jasmin Pho Porfilio5/14/20199:57 AM

## 2017-06-14 NOTE — Op Note (Signed)
PREOPERATIVE DIAGNOSIS:  Nuclear sclerotic cataract of the right eye.   POSTOPERATIVE DIAGNOSIS:  NUCLEAR SCLEROTIC CATARACT RIGHT EYE   OPERATIVE PROCEDURE: Procedure(s): CATARACT EXTRACTION PHACO AND INTRAOCULAR LENS PLACEMENT (IOC)   SURGEON:  Galen Manila, MD.   ANESTHESIA:  Anesthesiologist: Naomie Dean, MD CRNA: Rosario Jacks, CRNA  1.      Managed anesthesia care. 2.      0.80ml of Shugarcaine was instilled in the eye following the paracentesis.   COMPLICATIONS:  None.   TECHNIQUE:   Stop and chop   DESCRIPTION OF PROCEDURE:  The patient was examined and consented in the preoperative holding area where the aforementioned topical anesthesia was applied to the right eye and then brought back to the Operating Room where the right eye was prepped and draped in the usual sterile ophthalmic fashion and a lid speculum was placed. A paracentesis was created with the side port blade and the anterior chamber was filled with viscoelastic. A near clear corneal incision was performed with the steel keratome. A continuous curvilinear capsulorrhexis was performed with a cystotome followed by the capsulorrhexis forceps. Hydrodissection and hydrodelineation were carried out with BSS on a blunt cannula. The lens was removed in a stop and chop  technique and the remaining cortical material was removed with the irrigation-aspiration handpiece. The capsular bag was inflated with viscoelastic and the Technis [id number]  lens was placed in the capsular bag without complication. The remaining viscoelastic was removed from the eye with the irrigation-aspiration handpiece. The wounds were hydrated. The anterior chamber was flushed with Miostat and the eye was inflated to physiologic pressure. 0.49ml of Vigamox was placed in the anterior chamber. The wounds were found to be water tight. The eye was dressed with Vigamox. The patient was given protective glasses to wear throughout the day and a shield with  which to sleep tonight. The patient was also given drops with which to begin a drop regimen today and will follow-up with me in one day. Implant Name Type Inv. Item Serial No. Manufacturer Lot No. LRB No. Used  LENS IOL DIOP 19.5 - [id number] Intraocular Lens LENS IOL DIOP 19.5 [id number]  Right 1   Procedure(s) with comments: CATARACT EXTRACTION PHACO AND INTRAOCULAR LENS PLACEMENT (IOC) (Right) - [place] 00:20.0 AP% 9.9 CDE 2.00 Fluid pack lot # [id number]  Electronically signed: Galen Manila 06/14/2017 10:18 AM

## 2017-06-14 NOTE — Anesthesia Preprocedure Evaluation (Signed)
Anesthesia Evaluation  Patient identified by MRN, date of birth, ID band Patient awake    History of Anesthesia Complications (+) PONV  Airway Mallampati: II       Dental   Pulmonary neg sleep apnea, neg COPD, former smoker,           Cardiovascular (-) hypertension(-) Past MI and (-) CHF (-) dysrhythmias + Valvular Problems/Murmurs      Neuro/Psych neg Seizures Depression    GI/Hepatic Neg liver ROS, neg GERD  ,  Endo/Other  neg diabetes  Renal/GU negative Renal ROS     Musculoskeletal   Abdominal   Peds  Hematology   Anesthesia Other Findings   Reproductive/Obstetrics                             Anesthesia Physical Anesthesia Plan  ASA: II  Anesthesia Plan: MAC   Post-op Pain Management:    Induction:   PONV Risk Score and Plan:   Airway Management Planned: Nasal Cannula  Additional Equipment:   Intra-op Plan:   Post-operative Plan:   Informed Consent: I have reviewed the patients History and Physical, chart, labs and discussed the procedure including the risks, benefits and alternatives for the proposed anesthesia with the patient or authorized representative who has indicated his/her understanding and acceptance.     Plan Discussed with:   Anesthesia Plan Comments:         Anesthesia Quick Evaluation

## 2017-08-24 ENCOUNTER — Encounter: Payer: Self-pay | Admitting: Emergency Medicine

## 2017-08-24 ENCOUNTER — Emergency Department
Admission: EM | Admit: 2017-08-24 | Discharge: 2017-08-24 | Disposition: A | Discharge status: Home / Self Care | Payer: Commercial Managed Care - PPO | Attending: Emergency Medicine | Admitting: Emergency Medicine

## 2017-08-24 ENCOUNTER — Other Ambulatory Visit: Payer: Self-pay

## 2017-08-24 DIAGNOSIS — Z79899 Other long term (current) drug therapy: Secondary | ICD-10-CM | POA: Diagnosis not present

## 2017-08-24 DIAGNOSIS — Z23 Encounter for immunization: Secondary | ICD-10-CM | POA: Diagnosis not present

## 2017-08-24 DIAGNOSIS — Z87891 Personal history of nicotine dependence: Secondary | ICD-10-CM | POA: Diagnosis not present

## 2017-08-24 DIAGNOSIS — W540XXA Bitten by dog, initial encounter: Secondary | ICD-10-CM

## 2017-08-24 DIAGNOSIS — Z203 Contact with and (suspected) exposure to rabies: Secondary | ICD-10-CM | POA: Diagnosis present

## 2017-08-24 MED ORDER — RABIES IMMUNE GLOBULIN 150 UNIT/ML IM INJ
20.0000 [IU]/kg | INJECTION | Freq: Once | INTRAMUSCULAR | Status: AC
  Administered 2017-08-24: 1425 [IU] via INTRAMUSCULAR
  Filled 2017-08-24: qty 9.5

## 2017-08-24 MED ORDER — TETANUS-DIPHTH-ACELL PERTUSSIS 5-2.5-18.5 LF-MCG/0.5 IM SUSP
0.5000 mL | Freq: Once | INTRAMUSCULAR | Status: AC
  Administered 2017-08-24: 0.5 mL via INTRAMUSCULAR
  Filled 2017-08-24: qty 0.5

## 2017-08-24 MED ORDER — RABIES VACCINE, PCEC IM SUSR
1.0000 mL | Freq: Once | INTRAMUSCULAR | Status: AC
Start: 2017-08-24 — End: 2017-08-24
  Administered 2017-08-24: 1 mL via INTRAMUSCULAR
  Filled 2017-08-24: qty 1

## 2017-08-24 NOTE — ED Notes (Signed)
Patient with 2 to 3 puncture rooms to left middle finger from dog bite last night. Dried blood noted to tip of finger and nail bed. Patient unsure of last tetanus shot. States dog was 30 days past due on rabies vaccine.

## 2017-08-24 NOTE — ED Provider Notes (Signed)
[facility] Emergency Department Provider Note  ____________________________________________  Time seen: Approximately 5:19 PM  I have reviewed the triage vital signs and the nursing notes.   HISTORY  Chief Complaint Animal Bite    HPI Jasmin Mathews is a 62 y.o. female who presents the emergency department for rabies series.  Patient reports that yesterday her dog and her neighbor's dog started fighting and she put her hand in between them to separate them.  She has been on the third digit of her left hand.  Patient has 2 puncture wounds, one to the dorsal and one to the palmar aspect of the digit.  Patient was seen by her primary care, started on antibiotics and referred to the emergency department for rabies series.  The animal was 30 days past rabies vaccination without renewal.  Per the patient, the animal was not acting abnormal.  I discussed with the patient rabies series versus contacting animal control and having the animal observed.  Patient is adamant she wants rabies series.    Past Medical History:  Diagnosis Date  . Arthritis   . Depression   . Elevated cholesterol with high triglycerides   . Foot arch pain   . Heart murmur   . High cholesterol   . HOH (hard of hearing)   . PONV (postoperative nausea and vomiting)   . Wheezing     There are no active problems to display for this patient.   Past Surgical History:  Procedure Laterality Date  . CATARACT EXTRACTION W/PHACO Left 05/24/2017   Procedure: CATARACT EXTRACTION PHACO AND INTRAOCULAR LENS PLACEMENT (IOC);  Surgeon: Galen Manila, MD;  Location: [facility] ORS;  Service: Ophthalmology;  Laterality: Left;  [place] 00:21 AP% 11.0 CDE 2.32 Fluid pack lot # [id number]  . CATARACT EXTRACTION W/PHACO Right 06/14/2017   Procedure: CATARACT EXTRACTION PHACO AND INTRAOCULAR LENS PLACEMENT (IOC);  Surgeon: Galen Manila, MD;  Location: [facility] ORS;  Service: Ophthalmology;  Laterality: Right;  [place]  00:20.0 AP% 9.9 CDE 2.00 Fluid pack lot # [id number]  . COLONOSCOPY WITH PROPOFOL N/A 11/15/2014   Procedure: COLONOSCOPY WITH PROPOFOL;  Surgeon: Christena Deem, MD;  Location: [facility] ENDOSCOPY;  Service: Endoscopy;  Laterality: N/A;  . Fusion rt great toe Right    plate in rght great toe, and screws  . HALLUX VALGUS CORRECTION Right   . TYMPANOPLASTY      Prior to Admission medications   Medication Sig Start Date End Date Taking? Authorizing Provider  atorvastatin (LIPITOR) 20 MG tablet Take 20 mg by mouth at bedtime.     [provider]  Glucos-Chondroit-Hyaluron-MSM (GLUCOSAMINE CHONDROITIN JOINT PO) Take 1 tablet by mouth 2 (two) times a week.     [provider]  PARoxetine (PAXIL-CR) 37.5 MG 24 hr tablet Take 37.5 mg by mouth daily after lunch.     [provider]  venlafaxine (EFFEXOR) 75 MG tablet Take 75 mg by mouth 2 (two) times daily with a meal.  03/12/13   [provider]    Allergies Patient has no known allergies.  Family History  Problem Relation Age of Onset  . Breast cancer Neg Hx     Social History Social History   Tobacco Use  . Smoking status: Former Games developer  . Smokeless tobacco: Never Used  Substance Use Topics  . Alcohol use: Yes  . Drug use: No     Review of Systems  Constitutional: No fever/chills Eyes: No visual changes.  Cardiovascular: no chest pain. Respiratory:  no cough. No SOB. Gastrointestinal: No abdominal pain.  No nausea, no vomiting.   Musculoskeletal: Negative for musculoskeletal pain. Skin: Animal bite to the third digit left hand. Neurological: Negative for headaches, focal weakness or numbness. 10-point ROS otherwise negative.  ____________________________________________   PHYSICAL EXAM:  VITAL SIGNS: ED Triage Vitals  Enc Vitals Group     BP 08/24/17 1706 (!) 149/97     Pulse Rate 08/24/17 1706 78     Resp 08/24/17 1706 18     Temp 08/24/17 1706 98.6 F (37 C)     Temp Source  08/24/17 1706 Oral     SpO2 08/24/17 1706 98 %     Weight 08/24/17 1707 153 lb (69.4 kg)     Height 08/24/17 1707 5\' 5"  (1.651 m)     Head Circumference --      Peak Flow --      Pain Score 08/24/17 1706 6     Pain Loc --      Pain Edu? --      Excl. in GC? --      Constitutional: Alert and oriented. Well appearing and in no acute distress. Eyes: Conjunctivae are normal. PERRL. EOMI. Head: Atraumatic. Neck: No stridor.    Cardiovascular: Normal rate, regular rhythm. Normal S1 and S2.  Good peripheral circulation. Respiratory: Normal respiratory effort without tachypnea or retractions. Lungs CTAB. Good air entry to the bases with no decreased or absent breath sounds. Musculoskeletal: Full range of motion to all extremities. No gross deformities appreciated. Neurologic:  Normal speech and language. No gross focal neurologic deficits are appreciated.  Skin:  Skin is warm, dry and intact. No rash noted.  Patient with a puncture wound to the finger pad, nailbed.  No bleeding.  No foreign body.  Minor erythema in the area.  No indication of abscess.  No infectious sinusitis.  Full range of motion to the digit.  Sensation intact to the digit.  Capillary refill less than 2 seconds. Psychiatric: Mood and affect are normal. Speech and behavior are normal. Patient exhibits appropriate insight and judgement.   ____________________________________________   LABS (all labs ordered are listed, but only abnormal results are displayed)  Labs Reviewed - No data to display ____________________________________________  EKG   ____________________________________________  RADIOLOGY   No results found.  ____________________________________________    PROCEDURES  Procedure(s) performed:    Procedures    Medications  Tdap (BOOSTRIX) injection 0.5 mL (has no administration in time range)  rabies vaccine (RABAVERT) injection 1 mL (has no administration in time range)  rabies immune  globulin (HYPERAB/KEDRAB) injection 1,425 Units (has no administration in time range)     ____________________________________________   INITIAL IMPRESSION / ASSESSMENT AND PLAN / ED COURSE  Pertinent labs & imaging results that were available during my care of the patient were reviewed by me and considered in my medical decision making (see chart for details).  Review of the [place] CSRS was performed in accordance of the [facility] prior to dispensing any controlled drugs.      Patient's diagnosis is consistent with dog bite to the third digit left hand.  Patient presents the emergency department requesting rabies vaccination series.  Patient was Artie seen by her primary care and placed on Augmentin.  Patient's tetanus shot is updated today.  Patient requests rabies vaccination series which is provided.  Patient will return on days 3, 7, 14 for remaining rabies vaccinations..  No prescriptions at this time his primary care is Artie started  the patient on Augmentin.  Patient will follow-up in this department as listed above her primary care as needed.  Patient is given ED precautions to return to the ED for any worsening or new symptoms.     ____________________________________________  FINAL CLINICAL IMPRESSION(S) / ED DIAGNOSES  Final diagnoses:  Dog bite, initial encounter  Need for post exposure prophylaxis for rabies      NEW MEDICATIONS STARTED DURING THIS VISIT:  ED Discharge Orders    None          This chart was dictated using voice recognition software/Dragon. Despite best efforts to proofread, errors can occur which can change the meaning. Any change was purely unintentional.    Racheal Patches, PA-C 08/24/17 Claudius Sis, [place], MD 08/26/17 1949

## 2017-08-24 NOTE — Discharge Instructions (Addendum)
You must return in 3 days, in 7 days, and then 14 days for further rabies vaccinations.

## 2017-08-24 NOTE — ED Triage Notes (Addendum)
Patient reports being bit by dog on left middle finger yesterday. Reports dog was 30 days past due on rabies vaccines. Small puncture wounds noted to tip of finger, bleeding controlled.

## 2017-08-27 ENCOUNTER — Other Ambulatory Visit: Payer: Self-pay

## 2017-08-27 ENCOUNTER — Encounter: Payer: Self-pay | Admitting: Emergency Medicine

## 2017-08-27 ENCOUNTER — Emergency Department
Admission: EM | Admit: 2017-08-27 | Discharge: 2017-08-27 | Disposition: A | Discharge status: Home / Self Care | Payer: Commercial Managed Care - PPO | Attending: Emergency Medicine | Admitting: Emergency Medicine

## 2017-08-27 DIAGNOSIS — Z23 Encounter for immunization: Secondary | ICD-10-CM | POA: Insufficient documentation

## 2017-08-27 DIAGNOSIS — Z2914 Encounter for prophylactic rabies immune globin: Secondary | ICD-10-CM | POA: Insufficient documentation

## 2017-08-27 DIAGNOSIS — Z203 Contact with and (suspected) exposure to rabies: Secondary | ICD-10-CM | POA: Diagnosis not present

## 2017-08-27 MED ORDER — RABIES VACCINE, PCEC IM SUSR
1.0000 mL | Freq: Once | INTRAMUSCULAR | Status: AC
  Administered 2017-08-27: 1 mL via INTRAMUSCULAR
  Filled 2017-08-27: qty 1

## 2017-08-27 NOTE — ED Notes (Signed)
See triage note  States she is here for 2nd rabies shot  Denies any other sxs'

## 2017-08-27 NOTE — Discharge Instructions (Addendum)
Return to ED on 7/31 for injection and on 8/7

## 2017-08-27 NOTE — ED Triage Notes (Signed)
Pt here for second dose of rabies injection. No issues with first round of shots.

## 2017-08-27 NOTE — ED Provider Notes (Signed)
[facility] Emergency Department Provider Note  ____________________________________________   First MD Initiated Contact with Patient 08/27/17 [phone]     (approximate)  I have reviewed the triage vital signs and the nursing notes.   HISTORY  Chief Complaint Rabies Injection   HPI Jasmin Mathews is a 62 y.o. female presents to the ED for her second dose of rabies vaccine.  Patient states that she did not have any difficulties with her first injections.  She denies any problems with her injury.   Past Medical History:  Diagnosis Date  . Arthritis   . Depression   . Elevated cholesterol with high triglycerides   . Foot arch pain   . Heart murmur   . High cholesterol   . HOH (hard of hearing)   . PONV (postoperative nausea and vomiting)   . Wheezing     There are no active problems to display for this patient.   Past Surgical History:  Procedure Laterality Date  . CATARACT EXTRACTION W/PHACO Left 05/24/2017   Procedure: CATARACT EXTRACTION PHACO AND INTRAOCULAR LENS PLACEMENT (IOC);  Surgeon: Galen Manila, MD;  Location: [facility] ORS;  Service: Ophthalmology;  Laterality: Left;  [place] 00:21 AP% 11.0 CDE 2.32 Fluid pack lot # [id number]  . CATARACT EXTRACTION W/PHACO Right 06/14/2017   Procedure: CATARACT EXTRACTION PHACO AND INTRAOCULAR LENS PLACEMENT (IOC);  Surgeon: Galen Manila, MD;  Location: [facility] ORS;  Service: Ophthalmology;  Laterality: Right;  [place] 00:20.0 AP% 9.9 CDE 2.00 Fluid pack lot # [id number]  . COLONOSCOPY WITH PROPOFOL N/A 11/15/2014   Procedure: COLONOSCOPY WITH PROPOFOL;  Surgeon: Christena Deem, MD;  Location: [facility] ENDOSCOPY;  Service: Endoscopy;  Laterality: N/A;  . Fusion rt great toe Right    plate in rght great toe, and screws  . HALLUX VALGUS CORRECTION Right   . TYMPANOPLASTY      Prior to Admission medications   Medication Sig Start Date End Date Taking? Authorizing Provider  atorvastatin (LIPITOR) 20 MG tablet  Take 20 mg by mouth at bedtime.     [provider]  Glucos-Chondroit-Hyaluron-MSM (GLUCOSAMINE CHONDROITIN JOINT PO) Take 1 tablet by mouth 2 (two) times a week.     [provider]  PARoxetine (PAXIL-CR) 37.5 MG 24 hr tablet Take 37.5 mg by mouth daily after lunch.     [provider]  venlafaxine (EFFEXOR) 75 MG tablet Take 75 mg by mouth 2 (two) times daily with a meal.  03/12/13   [provider]    Allergies Patient has no known allergies.  Family History  Problem Relation Age of Onset  . Breast cancer Neg Hx     Social History Social History   Tobacco Use  . Smoking status: Former Games developer  . Smokeless tobacco: Never Used  Substance Use Topics  . Alcohol use: Yes  . Drug use: No    Review of Systems Constitutional: No fever/chills Cardiovascular: Denies chest pain. Respiratory: Denies shortness of breath. Skin: Positive for dog bite. Neurological: Negative for  focal weakness or numbness. ____________________________________________   PHYSICAL EXAM:  VITAL SIGNS: ED Triage Vitals  Enc Vitals Group     BP 08/27/17 0922 140/75     Pulse Rate 08/27/17 0922 80     Resp 08/27/17 0922 18     Temp 08/27/17 0922 98 F (36.7 C)     Temp Source 08/27/17 0922 Oral     SpO2 08/27/17 0922 98 %     Weight 08/27/17 0921 153 lb (  69.4 kg)     Height 08/27/17 0921 5\' 4"  (1.626 m)     Head Circumference --      Peak Flow --      Pain Score 08/27/17 0920 0     Pain Loc --      Pain Edu? --      Excl. in GC? --    Constitutional: Alert and oriented. Well appearing and in no acute distress. Eyes: Conjunctivae are normal.  Head: Atraumatic. Neck: No stridor.   Cardiovascular: Normal rate, regular rhythm. Grossly normal heart sounds.  Good peripheral circulation. Respiratory: Normal respiratory effort.  No retractions. Lungs CTAB. Musculoskeletal: Moves upper and lower extremities without difficulty and normal gait was noted. Neurologic:   Normal speech and language. No gross focal neurologic deficits are appreciated.  Skin:  Skin is warm, dry and intact.  Healing dog bite. Psychiatric: Mood and affect are normal. Speech and behavior are normal.  ____________________________________________   LABS (all labs ordered are listed, but only abnormal results are displayed)  Labs Reviewed - No data to display  PROCEDURES  Procedure(s) performed: None  Procedures  Critical Care performed: No  ____________________________________________   INITIAL IMPRESSION / ASSESSMENT AND PLAN / ED COURSE  As part of my medical decision making, I reviewed the following data within the electronic MEDICAL RECORD NUMBER Notes from prior ED visits and [place] Controlled Substance Database  Patient was given the exact dates for her next 2 vaccine dates.  Patient is continued keeping the area clean and dry and watch for any signs of infection.  ____________________________________________   FINAL CLINICAL IMPRESSION(S) / ED DIAGNOSES  Final diagnoses:  Need for post exposure prophylaxis for rabies     ED Discharge Orders    None       Note:  This document was prepared using Dragon voice recognition software and may include unintentional dictation errors.    Tommi RumpsSummers, [place]lee Campus L, PA-C 08/27/17 1129    Sharyn CreamerQuale, Mark, MD 08/27/17 1501

## 2017-08-31 ENCOUNTER — Emergency Department
Admission: EM | Admit: 2017-08-31 | Discharge: 2017-08-31 | Disposition: A | Discharge status: Home / Self Care | Payer: Commercial Managed Care - PPO | Attending: Emergency Medicine | Admitting: Emergency Medicine

## 2017-08-31 ENCOUNTER — Encounter: Payer: Self-pay | Admitting: Emergency Medicine

## 2017-08-31 DIAGNOSIS — Z203 Contact with and (suspected) exposure to rabies: Secondary | ICD-10-CM | POA: Diagnosis not present

## 2017-08-31 DIAGNOSIS — Z23 Encounter for immunization: Secondary | ICD-10-CM | POA: Diagnosis not present

## 2017-08-31 MED ORDER — RABIES VACCINE, PCEC IM SUSR
1.0000 mL | Freq: Once | INTRAMUSCULAR | Status: AC
  Administered 2017-08-31: 1 mL via INTRAMUSCULAR
  Filled 2017-08-31: qty 1

## 2017-08-31 NOTE — ED Triage Notes (Signed)
Pt here for another rabies shot

## 2017-08-31 NOTE — ED Provider Notes (Signed)
[facility] Emergency Department Provider Note  ____________________________________________  Time seen: Approximately 5:27 PM  I have reviewed the triage vital signs and the nursing notes.   HISTORY  Chief Complaint Rabies Injection    HPI Jasmin Mathews is a 62 y.o. female who presents the emergency department for her third rabies vaccination.  Patient was seen by myself originally, and has followed through with her series.  Patient received her immunoglobin, rabies vaccine, tetanus shot initially.  No complications with her animal bite.  No complications with her second rabies vaccine.  She is here for third vaccine only.    Past Medical History:  Diagnosis Date  . Arthritis   . Depression   . Elevated cholesterol with high triglycerides   . Foot arch pain   . Heart murmur   . High cholesterol   . HOH (hard of hearing)   . PONV (postoperative nausea and vomiting)   . Wheezing     There are no active problems to display for this patient.   Past Surgical History:  Procedure Laterality Date  . CATARACT EXTRACTION W/PHACO Left 05/24/2017   Procedure: CATARACT EXTRACTION PHACO AND INTRAOCULAR LENS PLACEMENT (IOC);  Surgeon: Galen ManilaPorfilio, William, MD;  Location: [facility] ORS;  Service: Ophthalmology;  Laterality: Left;  US 00:21 AP% 11.0 CDE 2.32 Fluid pack lot # [id number]  . CATARACT EXTRACTION W/PHACO Right 06/14/2017   Procedure: CATARACT EXTRACTION PHACO AND INTRAOCULAR LENS PLACEMENT (IOC);  Surgeon: Galen ManilaPorfilio, William, MD;  Location: [facility] ORS;  Service: Ophthalmology;  Laterality: Right;  US 00:20.0 AP% 9.9 CDE 2.00 Fluid pack lot # [id number]  . COLONOSCOPY WITH PROPOFOL N/A 11/15/2014   Procedure: COLONOSCOPY WITH PROPOFOL;  Surgeon: Christena DeemMartin U Skulskie, MD;  [facility];  Service: Endoscopy;  Laterality: N/A;  . Fusion rt great toe Right    plate in rght great toe, and screws  . HALLUX VALGUS CORRECTION Right   . TYMPANOPLASTY       Prior to Admission medications   Medication Sig Start Date End Date Taking? Authorizing Provider  atorvastatin (LIPITOR) 20 MG tablet Take 20 mg by mouth at bedtime.     [provider]  Glucos-Chondroit-Hyaluron-MSM (GLUCOSAMINE CHONDROITIN JOINT PO) Take 1 tablet by mouth 2 (two) times a week.     [provider]  PARoxetine (PAXIL-CR) 37.5 MG 24 hr tablet Take 37.5 mg by mouth daily after lunch.     [provider]  venlafaxine (EFFEXOR) 75 MG tablet Take 75 mg by mouth 2 (two) times daily with a meal.  03/12/13   [provider]    Allergies Patient has no known allergies.  Family History  Problem Relation Age of Onset  . Breast cancer Neg Hx     Social History Social History   Tobacco Use  . Smoking status: Former Games developermoker  . Smokeless tobacco: Never Used  Substance Use Topics  . Alcohol use: Yes  . Drug use: No     Review of Systems  Constitutional: No fever/chills Eyes: No visual changes. No discharge ENT: No upper respiratory complaints. Cardiovascular: no chest pain. Respiratory: no cough. No SOB. Gastrointestinal: No abdominal pain.  No nausea, no vomiting.  No diarrhea.  No constipation. Genitourinary: Negative for dysuria. No hematuria Musculoskeletal: Negative for musculoskeletal pain. Skin: Negative for rash, abrasions, lacerations, ecchymosis. Neurological: Negative for headaches, focal weakness or numbness. 10-point ROS otherwise negative.  ____________________________________________   PHYSICAL EXAM:  VITAL SIGNS: ED Triage Vitals  Enc Vitals Group  BP 08/31/17 1650 (!) 151/90     Pulse Rate 08/31/17 1650 70     Resp --      Temp 08/31/17 1650 98.4 F (36.9 C)     Temp Source 08/31/17 1650 Oral     SpO2 08/31/17 1650 97 %     Weight 08/31/17 1651 153 lb (69.4 kg)     Height 08/31/17 1651 5\' 5"  (1.651 m)     Head Circumference --      Peak Flow --      Pain Score 08/31/17 1647 0     Pain Loc --       Pain Edu? --      Excl. in GC? --      Constitutional: Alert and oriented. Well appearing and in no acute distress. Eyes: Conjunctivae are normal. PERRL. EOMI. Head: Atraumatic. ENT:      Ears:       Nose: No congestion/rhinnorhea.      Mouth/Throat: Mucous membranes are moist.  Neck: No stridor.  Neck is supple full range of motion Hematological/Lymphatic/Immunilogical: No cervical lymphadenopathy. Cardiovascular: Normal rate, regular rhythm. Normal S1 and S2.  Good peripheral circulation. Respiratory: Normal respiratory effort without tachypnea or retractions. Lungs CTAB. Good air entry to the bases with no decreased or absent breath sounds. Musculoskeletal: Full range of motion to all extremities. No gross deformities appreciated.  Of the third digit left hand reveals no residual indication of animal bite.  No erythema, edema.  Full range of motion to all digits.   Neurologic:  Normal speech and language. No gross focal neurologic deficits are appreciated.  Skin:  Skin is warm, dry and intact. No rash noted. Psychiatric: Mood and affect are normal. Speech and behavior are normal. Patient exhibits appropriate insight and judgement.   ____________________________________________   LABS (all labs ordered are listed, but only abnormal results are displayed)  Labs Reviewed - No data to display ____________________________________________  EKG   ____________________________________________  RADIOLOGY   No results found.  ____________________________________________    PROCEDURES  Procedure(s) performed:    Procedures    Medications  rabies vaccine (RABAVERT) injection 1 mL (has no administration in time range)     ____________________________________________   INITIAL IMPRESSION / ASSESSMENT AND PLAN / ED COURSE  Pertinent labs & imaging results that were available during my care of the patient were reviewed by me and considered in my medical decision  making (see chart for details).  Review of the [place] CSRS was performed in accordance of the [facility] prior to dispensing any controlled drugs.      Patient's diagnosis is consistent with encounter for rabies vaccination.  Patient presents the emergency department for third rabies vaccine.  No complications with prior to administrations.  No complications from initial wound.  Third rabies vaccination administered, she will follow-up in a week for her last rabies vaccine..  Patient will follow primary care as needed.  Patient is given ED precautions to return to the ED for any worsening or new symptoms.     ____________________________________________  FINAL CLINICAL IMPRESSION(S) / ED DIAGNOSES  Final diagnoses:  Encounter for repeat administration of rabies vaccination      NEW MEDICATIONS STARTED DURING THIS VISIT:  ED Discharge Orders    None          This chart was dictated using voice recognition software/Dragon. Despite best efforts to proofread, errors can occur which can change the meaning. Any change was purely unintentional.    Gregery Walberg, Christiane Ha  D, PA-C 08/31/17 1736    Jeanmarie Plant, MD 08/31/17 [phone]

## 2017-09-07 ENCOUNTER — Other Ambulatory Visit: Payer: Self-pay

## 2017-09-07 ENCOUNTER — Encounter: Payer: Self-pay | Admitting: Emergency Medicine

## 2017-09-07 ENCOUNTER — Emergency Department
Admission: EM | Admit: 2017-09-07 | Discharge: 2017-09-07 | Disposition: A | Discharge status: Home / Self Care | Payer: Commercial Managed Care - PPO | Attending: Emergency Medicine | Admitting: Emergency Medicine

## 2017-09-07 DIAGNOSIS — Z203 Contact with and (suspected) exposure to rabies: Secondary | ICD-10-CM | POA: Insufficient documentation

## 2017-09-07 DIAGNOSIS — Z23 Encounter for immunization: Secondary | ICD-10-CM | POA: Diagnosis not present

## 2017-09-07 MED ORDER — RABIES VACCINE, PCEC IM SUSR
1.0000 mL | Freq: Once | INTRAMUSCULAR | Status: AC
  Administered 2017-09-07: 1 mL via INTRAMUSCULAR
  Filled 2017-09-07: qty 1

## 2017-09-07 NOTE — ED Provider Notes (Signed)
[facility] Emergency Department Provider Note  ____________________________________________   First MD Initiated Contact with Patient 09/07/17 1654     (approximate)  I have reviewed the triage vital signs and the nursing notes.   HISTORY  Chief Complaint Rabies Injection  HPI Jaton Eilers is a 62 y.o. female is here for her last rabies injection.  She has not had any problems or continued concerns.  Past Medical History:  Diagnosis Date  . Arthritis   . Depression   . Elevated cholesterol with high triglycerides   . Foot arch pain   . Heart murmur   . High cholesterol   . HOH (hard of hearing)   . PONV (postoperative nausea and vomiting)   . Wheezing     There are no active problems to display for this patient.   Past Surgical History:  Procedure Laterality Date  . CATARACT EXTRACTION W/PHACO Left 05/24/2017   Procedure: CATARACT EXTRACTION PHACO AND INTRAOCULAR LENS PLACEMENT (IOC);  Surgeon: Galen Manila, MD;  Location: [facility] ORS;  Service: Ophthalmology;  Laterality: Left;  [place] 00:21 AP% 11.0 CDE 2.32 Fluid pack lot # [id number]  . CATARACT EXTRACTION W/PHACO Right 06/14/2017   Procedure: CATARACT EXTRACTION PHACO AND INTRAOCULAR LENS PLACEMENT (IOC);  Surgeon: Galen Manila, MD;  Location: [facility] ORS;  Service: Ophthalmology;  Laterality: Right;  [place] 00:20.0 AP% 9.9 CDE 2.00 Fluid pack lot # [id number]  . COLONOSCOPY WITH PROPOFOL N/A 11/15/2014   Procedure: COLONOSCOPY WITH PROPOFOL;  Surgeon: Christena Deem, MD;  Location: [facility] ENDOSCOPY;  Service: Endoscopy;  Laterality: N/A;  . Fusion rt great toe Right    plate in rght great toe, and screws  . HALLUX VALGUS CORRECTION Right   . TYMPANOPLASTY      Prior to Admission medications   Medication Sig Start Date End Date Taking? Authorizing Provider  atorvastatin (LIPITOR) 20 MG tablet Take 20 mg by mouth at bedtime.     [provider]  Glucos-Chondroit-Hyaluron-MSM  (GLUCOSAMINE CHONDROITIN JOINT PO) Take 1 tablet by mouth 2 (two) times a week.     [provider]  PARoxetine (PAXIL-CR) 37.5 MG 24 hr tablet Take 37.5 mg by mouth daily after lunch.     [provider]  venlafaxine (EFFEXOR) 75 MG tablet Take 75 mg by mouth 2 (two) times daily with a meal.  03/12/13   [provider]    Allergies Patient has no known allergies.  Family History  Problem Relation Age of Onset  . Breast cancer Neg Hx     Social History Social History   Tobacco Use  . Smoking status: Former Games developer  . Smokeless tobacco: Never Used  Substance Use Topics  . Alcohol use: Yes  . Drug use: No    Review of Systems Constitutional: No fever/chills Cardiovascular: Denies chest pain. Respiratory: Denies shortness of breath. Skin: Negative for rash. Neurological: Negative for headaches ___________________________________________   PHYSICAL EXAM:  VITAL SIGNS: ED Triage Vitals  Enc Vitals Group     BP 09/07/17 1656 (!) 111/93     Pulse Rate 09/07/17 1656 75     Resp 09/07/17 1656 18     Temp 09/07/17 1656 97.8 F (36.6 C)     Temp Source 09/07/17 1656 Oral     SpO2 09/07/17 1656 98 %     Weight 09/07/17 1654 153 lb (69.4 kg)     Height 09/07/17 1654 5\' 5"  (1.651 m)     Head Circumference --  Peak Flow --      Pain Score 09/07/17 1654 0     Pain Loc --      Pain Edu? --      Excl. in GC? --    Constitutional: Alert and oriented. Well appearing and in no acute distress. Eyes: Conjunctivae are normal.  Head: Atraumatic. Neck: No stridor.   Cardiovascular: Normal rate, regular rhythm. Grossly normal heart sounds.  Good peripheral circulation. Respiratory: Normal respiratory effort.  No retractions. Lungs CTAB. Neurologic:  Normal speech and language. No gross focal neurologic deficits are appreciated. No gait instability. Skin:  Skin is warm, dry and intact. No rash noted. Psychiatric: Mood and affect are normal. Speech and  behavior are normal.  ____________________________________________   LABS (all labs ordered are listed, but only abnormal results are displayed)  Labs Reviewed - No data to display  PROCEDURES  Procedure(s) performed: None  Procedures  Critical Care performed: No  ____________________________________________   INITIAL IMPRESSION / ASSESSMENT AND PLAN / ED COURSE  As part of my medical decision making, I reviewed the following data within the electronic MEDICAL RECORD NUMBER Notes from prior ED visits and [place] Controlled Substance Database  Patient denies any difficulties.  This is her last rabies injection.  She is to follow-up with her PCP if any problems or concerns. ____________________________________________   FINAL CLINICAL IMPRESSION(S) / ED DIAGNOSES  Final diagnoses:  Need for prophylactic vaccination against rabies     ED Discharge Orders    None       Note:  This document was prepared using Dragon voice recognition software and may include unintentional dictation errors.    Tommi RumpsSummers, Rhonda L, PA-C 09/07/17 1711    Dionne BucySiadecki, Sebastian, MD 09/07/17 2012

## 2017-09-07 NOTE — ED Triage Notes (Signed)
Here for rabies vaccine

## 2017-09-07 NOTE — Discharge Instructions (Signed)
Follow-up with your primary care provider if any continued problems or concerns.  This is your last injection.

## 2017-09-07 NOTE — ED Triage Notes (Signed)
Pt arrived to ED via POV stating that she is here for her last rabies injection. Pt is NAD, no pain, respirations even and non labored.

## 2019-02-22 ENCOUNTER — Other Ambulatory Visit: Payer: Self-pay | Admitting: Family Medicine

## 2019-02-22 DIAGNOSIS — Z1231 Encounter for screening mammogram for malignant neoplasm of breast: Secondary | ICD-10-CM

## 2019-03-06 ENCOUNTER — Ambulatory Visit
Admission: RE | Admit: 2019-03-06 | Discharge: 2019-03-06 | Disposition: A | Discharge status: Home / Self Care | Payer: Commercial Managed Care - PPO | Source: Ambulatory Visit | Attending: Family Medicine | Admitting: Family Medicine

## 2019-03-06 ENCOUNTER — Other Ambulatory Visit: Payer: Self-pay

## 2019-03-06 DIAGNOSIS — Z1231 Encounter for screening mammogram for malignant neoplasm of breast: Secondary | ICD-10-CM | POA: Diagnosis not present

## 2021-06-22 IMAGING — MG DIGITAL SCREENING BILAT W/ TOMO W/ CAD
8 series · 8 of 24 positions shown · non-contrast
Comparison: Previous exam(s).

CLINICAL DATA: Screening.

EXAM:
DIGITAL SCREENING BILATERAL MAMMOGRAM WITH TOMO AND CAD

[L CC synth-2D]
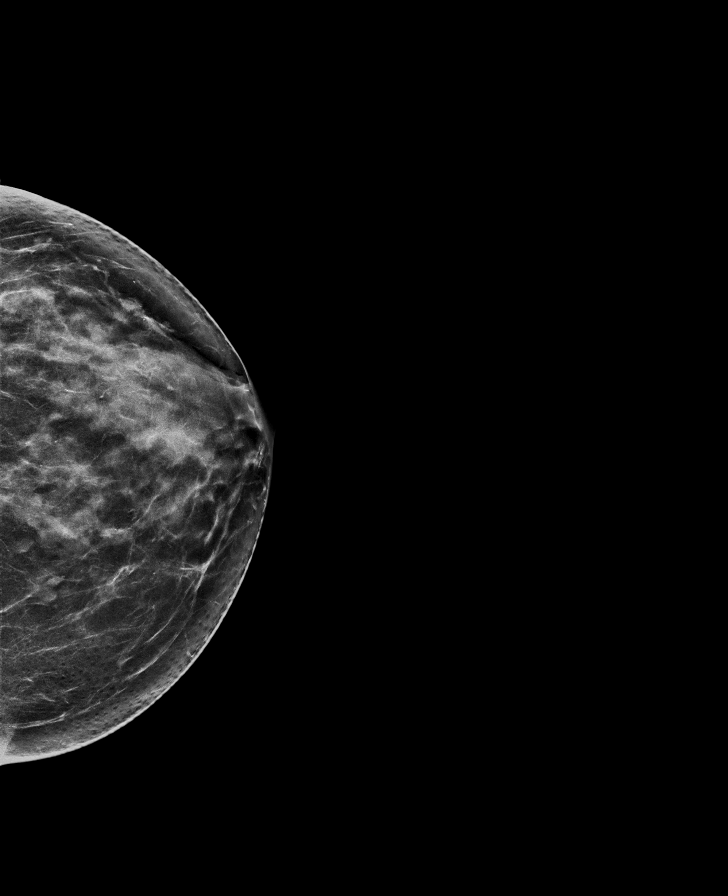

[L MLO synth-2D]
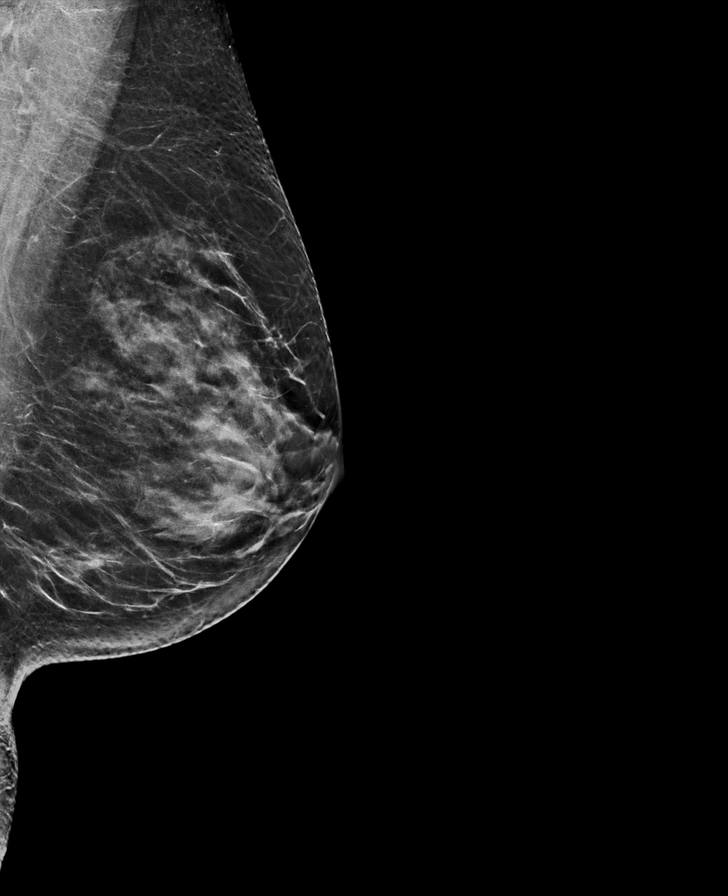

[R MLO synth-2D]
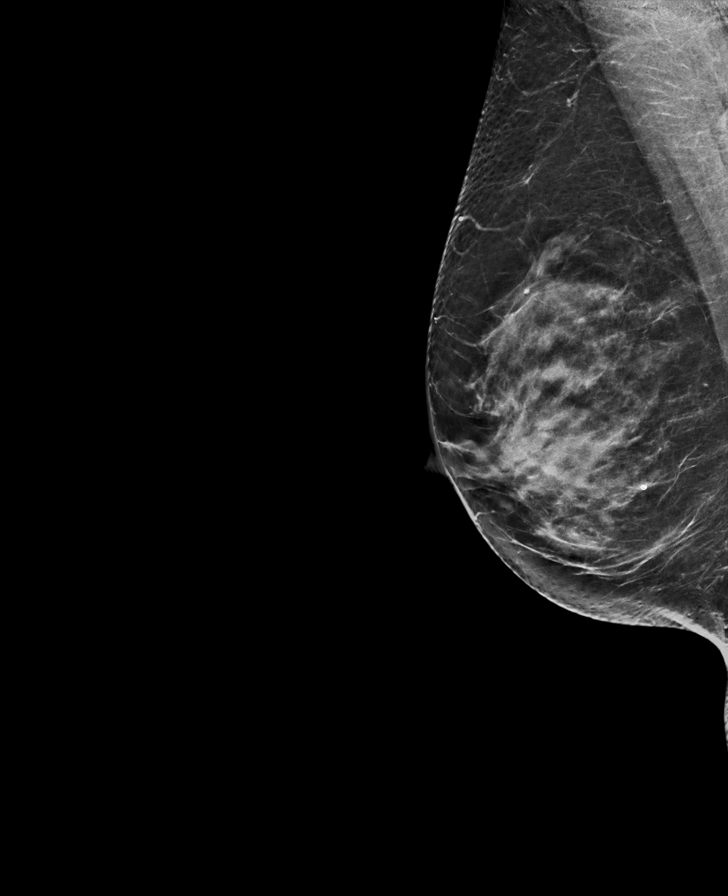

[R CC synth-2D]
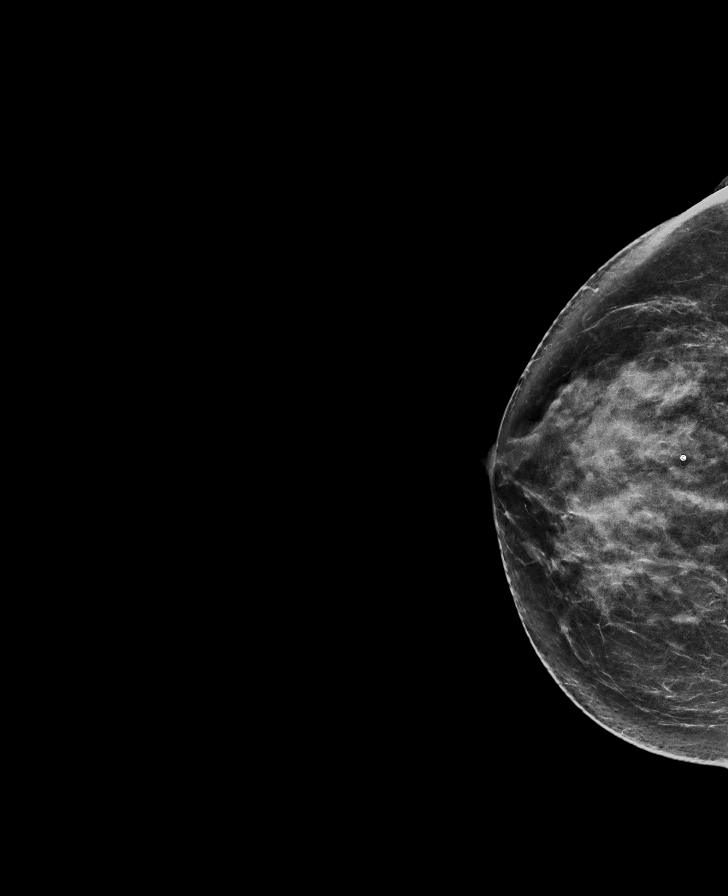

[L MLO tomo · tomo slice 37/72.0]
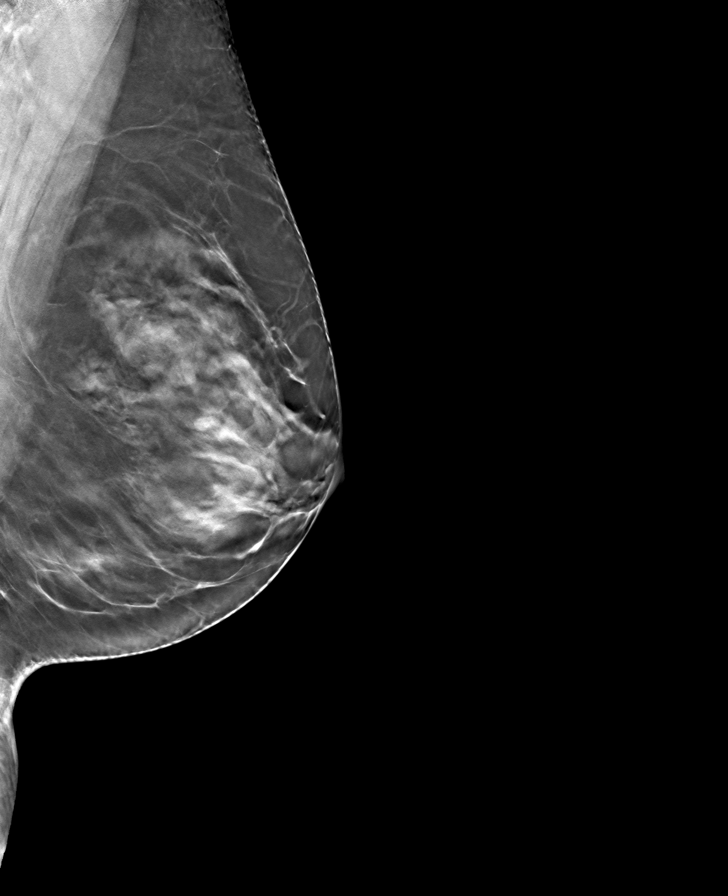

[R CC tomo · tomo slice 38/75.0]
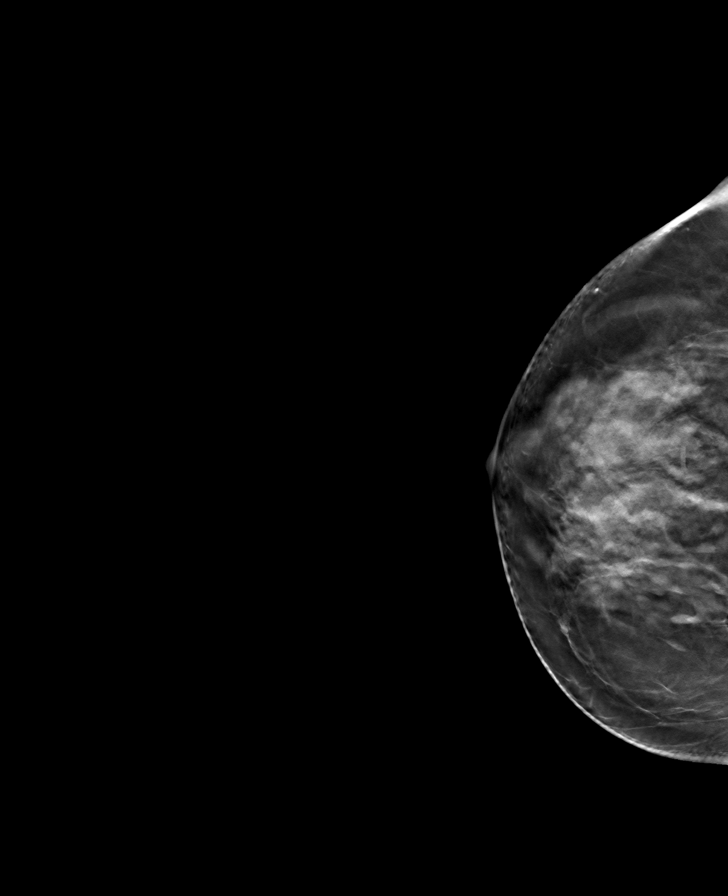

[L CC tomo · tomo slice 39/76.0]
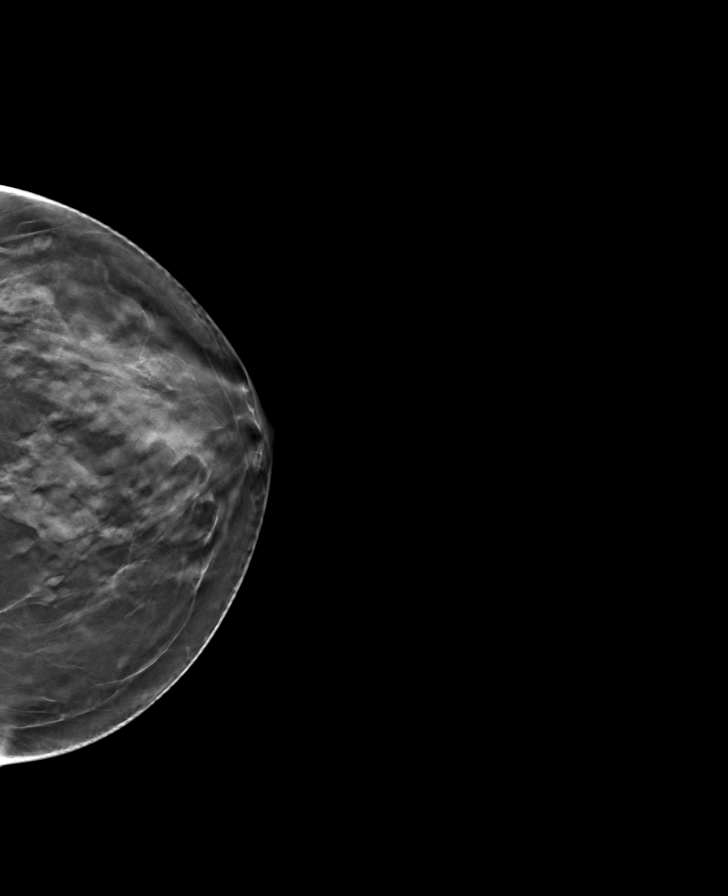

[R MLO tomo · tomo slice 37/73.0]
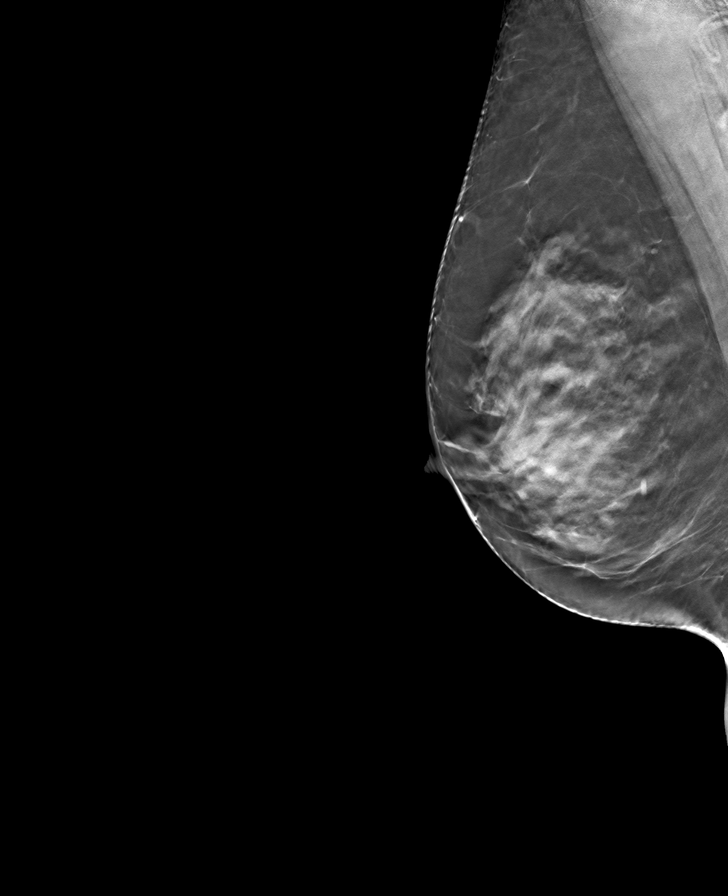

[8 of 24 positions shown; findings below may reference images not displayed]

ACR Breast Density Category c: The breast tissue is heterogeneously
dense, which may obscure small masses.
FINDINGS: There are no findings suspicious for malignancy. Images were
processed with CAD.
IMPRESSION: No mammographic evidence of malignancy. A result letter of this
screening mammogram will be mailed directly to the patient.

RECOMMENDATION:
Screening mammogram in one year. (Code:[id number])

BI-RADS CATEGORY  1: Negative.

## 2021-07-08 ENCOUNTER — Other Ambulatory Visit: Payer: Self-pay | Admitting: Family Medicine

## 2021-07-08 DIAGNOSIS — Z780 Asymptomatic menopausal state: Secondary | ICD-10-CM

## 2021-07-08 DIAGNOSIS — Z1231 Encounter for screening mammogram for malignant neoplasm of breast: Secondary | ICD-10-CM

## 2021-08-12 ENCOUNTER — Ambulatory Visit
Admission: RE | Admit: 2021-08-12 | Discharge: 2021-08-12 | Disposition: A | Discharge status: Home / Self Care | Payer: Medicare Other | Source: Ambulatory Visit | Attending: Family Medicine | Admitting: Family Medicine

## 2021-08-12 DIAGNOSIS — Z1231 Encounter for screening mammogram for malignant neoplasm of breast: Secondary | ICD-10-CM | POA: Diagnosis present

## 2021-08-12 DIAGNOSIS — Z780 Asymptomatic menopausal state: Secondary | ICD-10-CM | POA: Diagnosis present

## 2022-07-16 ENCOUNTER — Other Ambulatory Visit: Payer: Self-pay | Admitting: Family Medicine

## 2022-07-16 DIAGNOSIS — Z1231 Encounter for screening mammogram for malignant neoplasm of breast: Secondary | ICD-10-CM

## 2022-08-16 ENCOUNTER — Ambulatory Visit
Admission: RE | Admit: 2022-08-16 | Discharge: 2022-08-16 | Disposition: A | Discharge status: Home / Self Care | Payer: Medicare Other | Source: Ambulatory Visit | Attending: Family Medicine | Admitting: Family Medicine

## 2022-08-16 DIAGNOSIS — Z1231 Encounter for screening mammogram for malignant neoplasm of breast: Secondary | ICD-10-CM | POA: Insufficient documentation

## 2023-04-21 ENCOUNTER — Other Ambulatory Visit: Payer: Self-pay

## 2023-04-21 ENCOUNTER — Emergency Department
Admission: EM | Admit: 2023-04-21 | Discharge: 2023-04-21 | Disposition: A | Discharge status: Home / Self Care | Attending: Emergency Medicine | Admitting: Emergency Medicine

## 2023-04-21 ENCOUNTER — Encounter: Payer: Self-pay | Admitting: Emergency Medicine

## 2023-04-21 ENCOUNTER — Emergency Department

## 2023-04-21 DIAGNOSIS — W010XXA Fall on same level from slipping, tripping and stumbling without subsequent striking against object, initial encounter: Secondary | ICD-10-CM | POA: Insufficient documentation

## 2023-04-21 DIAGNOSIS — S52501A Unspecified fracture of the lower end of right radius, initial encounter for closed fracture: Secondary | ICD-10-CM | POA: Diagnosis not present

## 2023-04-21 DIAGNOSIS — S6991XA Unspecified injury of right wrist, hand and finger(s), initial encounter: Secondary | ICD-10-CM | POA: Diagnosis present

## 2023-04-21 MED ORDER — LIDOCAINE HCL (PF) 1 % IJ SOLN
5.0000 mL | Freq: Once | INTRAMUSCULAR | Status: AC
  Administered 2023-04-21: 5 mL via INTRADERMAL
  Filled 2023-04-21: qty 5

## 2023-04-21 MED ORDER — OXYCODONE HCL 5 MG PO TABS: 5.0000 mg | ORAL_TABLET | Freq: Three times a day (TID) | ORAL | 0 refills | Status: AC | PRN

## 2023-04-21 MED ORDER — OXYCODONE HCL 5 MG PO TABS
5.0000 mg | ORAL_TABLET | Freq: Once | ORAL | Status: AC
  Administered 2023-04-21: 5 mg via ORAL
  Filled 2023-04-21: qty 1

## 2023-04-21 NOTE — Discharge Instructions (Signed)
 Please follow-up with orthopedics in a week for reassessment.  You will wear the splint and sling at all times until then.  Please keep it clean, dry, and intact.  Please return for any sharp or severe worsening pain, numbness in your fingertips, or noticeable discoloration of your fingertips.

## 2023-04-21 NOTE — ED Provider Notes (Signed)
 [facility] Provider Note    Event Date/Time   First MD Initiated Contact with Patient 04/21/23 1031     (approximate)   History   Wrist Pain   HPI Jasmin Mathews is a 68 y.o. female presenting today for right wrist pain.  Patient states last night she tripped over her dog and fell on her right wrist.  She noted pain and swelling but no obvious deformity.  Ongoing pain and swelling this morning and came to the ED for further evaluation.  Denies any numbness or tingling.  Denies any injury elsewhere to her body.  No open wounds.     Physical Exam   Triage Vital Signs: ED Triage Vitals  Encounter Vitals Group     BP 04/21/23 0946 (!) 140/83     Systolic BP Percentile --      Diastolic BP Percentile --      Pulse Rate 04/21/23 0946 78     Resp 04/21/23 0946 20     Temp 04/21/23 0946 98.4 F (36.9 C)     Temp Source 04/21/23 0946 Oral     SpO2 04/21/23 0946 97 %     Weight 04/21/23 0944 160 lb (72.6 kg)     Height 04/21/23 0944 5' 4.5" (1.638 m)     Head Circumference --      Peak Flow --      Pain Score 04/21/23 0944 5     Pain Loc --      Pain Education --      Exclude from Growth Chart --     Most recent vital signs: Vitals:   04/21/23 0946  BP: (!) 140/83  Pulse: 78  Resp: 20  Temp: 98.4 F (36.9 C)  SpO2: 97%   I have reviewed the vital signs. General:  Awake, alert, no acute distress. Head:  Normocephalic, Atraumatic. EENT:  PERRL, EOMI, Oral mucosa pink and moist, Neck is supple. Cardiovascular: Regular rate, 2+ distal pulses. Respiratory:  Normal respiratory effort, symmetrical expansion, no distress.   Extremities: Swelling to the dorsal aspect of the right wrist and hand.  No tenderness to palpation elsewise to the right upper extremity aside from the wrist area. Neuro:  Alert and oriented.  Interacting appropriately.  Sensation intact throughout right upper extremity. Skin:  Warm, dry, no rash.   Psych: Appropriate affect.     ED Results / Procedures / Treatments   Labs (all labs ordered are listed, but only abnormal results are displayed) Labs Reviewed - No data to display   EKG    RADIOLOGY Independently interpreted x-ray showing evidence of comminuted and displaced distal radius fracture   PROCEDURES:  Critical Care performed: No  .Reduction of fracture  Date/Time: 04/21/2023 12:06 PM  Performed by: Janith Lima, MD Authorized by: Janith Lima, MD  Consent: Verbal consent obtained. Risks and benefits: risks, benefits and alternatives were discussed Consent given by: patient Patient identity confirmed: verbally with patient Time out: Immediately prior to procedure a "time out" was called to verify the correct patient, procedure, equipment, support staff and site/side marked as required. Preparation: Patient was prepped and draped in the usual sterile fashion. Local anesthesia used: yes Anesthesia: hematoma block  Anesthesia: Local anesthesia used: yes Local Anesthetic: lidocaine 1% without epinephrine Anesthetic total: 5 mL  Sedation: Patient sedated: no  Patient tolerance: patient tolerated the procedure well with no immediate complications   .Splint Application  Date/Time: 04/21/2023 12:06 PM  Performed by: Anner Crete,  Donetta Potts, MD Authorized by: Janith Lima, MD   Consent:    Consent obtained:  Verbal   Consent given by:  Patient   Risks, benefits, and alternatives were discussed: yes     Risks discussed:  Discoloration, pain and numbness   Alternatives discussed:  No treatment Universal protocol:    Patient identity confirmed:  Verbally with patient Pre-procedure details:    Distal neurologic exam:  Normal   Distal perfusion: distal pulses strong and brisk capillary refill   Procedure details:    Location:  Wrist   Wrist location:  R wrist   Splint type:  Sugar tong   Supplies:  Elastic bandage, sling, fiberglass and cotton padding   Attestation: Splint applied  and adjusted personally by me   Post-procedure details:    Distal neurologic exam:  Normal   Distal perfusion: distal pulses strong and brisk capillary refill     Procedure completion:  Tolerated well, no immediate complications   Post-procedure imaging: reviewed      MEDICATIONS ORDERED IN ED: Medications  oxyCODONE (Oxy IR/ROXICODONE) immediate release tablet 5 mg (5 mg Oral Given 04/21/23 1053)  lidocaine (PF) (XYLOCAINE) 1 % injection 5 mL (5 mLs Intradermal Given by Other 04/21/23 1054)     IMPRESSION / MDM / ASSESSMENT AND PLAN / ED COURSE  I reviewed the triage vital signs and the nursing notes.                              Differential diagnosis includes, but is not limited to, right distal radius/ulnar fracture  Patient's presentation is most consistent with acute complicated illness / injury requiring diagnostic workup.  Patient is a 68 year old female presenting today for fall with right wrist pain.  All occurred last night.  X-rays show evidence of distal radius fracture.  Attempted hematoma block with hanging at the bedside for fracture reduction.  Follow-up x-ray shows improvement in fracture displacement.  Will discharge with orthopedic follow-up and given strict return precautions for any issues with the splint or her extremity.     FINAL CLINICAL IMPRESSION(S) / ED DIAGNOSES   Final diagnoses:  Closed fracture of distal end of right radius, unspecified fracture morphology, initial encounter     Rx / DC Orders   ED Discharge Orders          Ordered    AMB referral to orthopedics        04/21/23 1234    oxyCODONE (ROXICODONE) 5 MG immediate release tablet  Every 8 hours PRN        04/21/23 1244             Note:  This document was prepared using Dragon voice recognition software and may include unintentional dictation errors.   Janith Lima, MD 04/21/23 [id number]

## 2023-04-21 NOTE — ED Notes (Signed)
 See triage notes. Patient tripped over her dog last night injuring her right wrist. Patient has a homemade splint on.

## 2023-07-08 ENCOUNTER — Other Ambulatory Visit: Payer: Self-pay | Admitting: Family Medicine

## 2023-07-08 DIAGNOSIS — Z1231 Encounter for screening mammogram for malignant neoplasm of breast: Secondary | ICD-10-CM

## 2023-08-17 ENCOUNTER — Ambulatory Visit
Admission: RE | Admit: 2023-08-17 | Discharge: 2023-08-17 | Disposition: A | Discharge status: Home / Self Care | Source: Ambulatory Visit | Attending: Family Medicine | Admitting: Family Medicine

## 2023-08-17 DIAGNOSIS — Z1231 Encounter for screening mammogram for malignant neoplasm of breast: Secondary | ICD-10-CM | POA: Diagnosis present
